# Patient Record
Sex: Male | Born: 1985
Health system: Southern US, Community
[De-identification: ages and names within clinical notes are randomized; demographics above are authoritative.]

## PROBLEM LIST (undated history)

## (undated) ENCOUNTER — Ambulatory Visit (HOSPITAL_COMMUNITY): Admission: EM | Payer: Self-pay | Source: Home / Self Care

## (undated) DIAGNOSIS — E785 Hyperlipidemia, unspecified: Secondary | ICD-10-CM

## (undated) DIAGNOSIS — D649 Anemia, unspecified: Secondary | ICD-10-CM

## (undated) DIAGNOSIS — E119 Type 2 diabetes mellitus without complications: Secondary | ICD-10-CM

## (undated) HISTORY — PX: NO PAST SURGERIES: SHX2092

## (undated) HISTORY — DX: Hyperlipidemia, unspecified: E78.5

## (undated) HISTORY — DX: Anemia, unspecified: D64.9

## (undated) HISTORY — DX: Type 2 diabetes mellitus without complications: E11.9

---
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
## 2012-03-23 ENCOUNTER — Encounter (HOSPITAL_COMMUNITY): Payer: Self-pay | Admitting: *Deleted

## 2012-03-23 ENCOUNTER — Emergency Department (HOSPITAL_COMMUNITY)
Admission: EM | Admit: 2012-03-23 | Discharge: 2012-03-24 | Payer: Self-pay | Attending: Emergency Medicine | Admitting: Emergency Medicine

## 2012-03-23 ENCOUNTER — Emergency Department (HOSPITAL_COMMUNITY): Payer: Self-pay

## 2012-03-23 DIAGNOSIS — S1191XA Laceration without foreign body of unspecified part of neck, initial encounter: Secondary | ICD-10-CM

## 2012-03-23 DIAGNOSIS — E669 Obesity, unspecified: Secondary | ICD-10-CM | POA: Insufficient documentation

## 2012-03-23 DIAGNOSIS — S1190XA Unspecified open wound of unspecified part of neck, initial encounter: Secondary | ICD-10-CM | POA: Insufficient documentation

## 2012-03-23 DIAGNOSIS — W278XXA Contact with other nonpowered hand tool, initial encounter: Secondary | ICD-10-CM | POA: Insufficient documentation

## 2012-03-23 MED ORDER — SODIUM CHLORIDE 0.9 % IV BOLUS (SEPSIS)
1000.0000 mL | Freq: Once | INTRAVENOUS | Status: AC
  Administered 2012-03-23: 1000 mL via INTRAVENOUS

## 2012-03-23 MED ORDER — TETANUS-DIPHTH-ACELL PERTUSSIS 5-2.5-18.5 LF-MCG/0.5 IM SUSP
0.5000 mL | Freq: Once | INTRAMUSCULAR | Status: AC
  Administered 2012-03-23: 0.5 mL via INTRAMUSCULAR
  Filled 2012-03-23: qty 0.5

## 2012-03-23 MED ORDER — IOHEXOL 350 MG/ML SOLN
50.0000 mL | Freq: Once | INTRAVENOUS | Status: AC | PRN
  Administered 2012-03-23: 50 mL via INTRAVENOUS
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
## 2012-03-23 NOTE — ED Notes (Signed)
Blood noted to new dry sterile gauze. Re-inforced with gauze and hypafix tape, will continue to monitor. No other changes. Denies sx or pain.  VSS.To CT.

## 2012-03-23 NOTE — ED Notes (Signed)
MD Kohut at bedside. 
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
## 2012-03-23 NOTE — ED Notes (Signed)
Brought pt directly back from lobby, Dr. Juleen China to bedside to assess pt

## 2012-03-23 NOTE — ED Notes (Signed)
Report given to [facility], [place], pt to go from CT to CDU. CT notified.
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
## 2012-03-23 NOTE — ED Notes (Signed)
Pt has laceration to rt side of neck/ pt denies pain/ pt is alert and oriented x4, laceration dressed and bleeding is controlled

## 2012-03-23 NOTE — ED Notes (Signed)
[employer] is at pt bedside.
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
## 2012-03-24 NOTE — ED Notes (Signed)
MD is at the Bedside with suture cart

## 2012-04-04 ENCOUNTER — Emergency Department (HOSPITAL_COMMUNITY)
Admission: EM | Admit: 2012-04-04 | Discharge: 2012-04-05 | Discharge status: Against Medical Advice | Payer: Self-pay | Attending: Emergency Medicine | Admitting: Emergency Medicine

## 2012-04-04 ENCOUNTER — Encounter (HOSPITAL_COMMUNITY): Payer: Self-pay | Admitting: Emergency Medicine

## 2012-04-04 DIAGNOSIS — M542 Cervicalgia: Secondary | ICD-10-CM | POA: Insufficient documentation

## 2012-04-04 NOTE — ED Notes (Signed)
Pt's name called x2 no answer 
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
## 2014-04-23 DIAGNOSIS — S01511A Laceration without foreign body of lip, initial encounter: Secondary | ICD-10-CM | POA: Insufficient documentation

## 2014-04-24 ENCOUNTER — Encounter (HOSPITAL_COMMUNITY): Payer: Self-pay | Admitting: Emergency Medicine

## 2014-04-24 ENCOUNTER — Emergency Department (HOSPITAL_COMMUNITY)
Admission: EM | Admit: 2014-04-24 | Discharge: 2014-04-24 | Disposition: A | Discharge status: Home / Self Care | Payer: Self-pay | Attending: Emergency Medicine | Admitting: Emergency Medicine

## 2014-04-24 DIAGNOSIS — S01511A Laceration without foreign body of lip, initial encounter: Secondary | ICD-10-CM

## 2014-04-24 MED ORDER — LIDOCAINE-EPINEPHRINE (PF) 2 %-1:200000 IJ SOLN
10.0000 mL | Freq: Once | INTRAMUSCULAR | Status: AC
  Administered 2014-04-24: 10 mL
  Filled 2014-04-24: qty 10

## 2014-04-24 NOTE — Discharge Instructions (Signed)
Keep area clean and dry with soap and water. Return for suture removal in 5-7 days.  Open Wound, Lip An open wound is a break in the skin caused by an injury. An open wound to the lip can be a scrape, cut, or hole (puncture). Good wound care will help:   Lessen pain.  Prevent infection.  Lessen scarring. HOME CARE  Wash off all dirt.  Clean your wounds daily with gentle soap and water.  Eat soft foods or liquids while your wound is healing.  Rinse the wound with salt water after each meal.  Apply medicated cream after the wound has been cleaned as told by your doctor.  Follow up with your doctor as told. GET HELP RIGHT AWAY IF:   There is more redness or puffiness (swelling) in or around the wound.  There is increasing pain.  You have a temperature by mouth above 102 F (38.9 C), not controlled by medicine.  Your baby is older than 3 months with a rectal temperature of 102 F (38.9 C) or higher.  Your baby is 643 months old or younger with a rectal temperature of 100.4 F (38 C) or higher.  There is yellowish white fluid (pus) coming from the wound.  Very bad pain develops that is not controlled with medicine.  There is a red line on the skin above or below the wound. MAKE SURE YOU:   Understand these instructions.  Will watch this condition.  Will get help right away if you are not doing well or get worse. Document Released: 09/30/2008 Document Revised: 09/26/2011 Document Reviewed: 10/20/2009 [facility] Patient Information 2015 [employer] [place]. This information is not intended to replace advice given to you by your health care provider. Make sure you discuss any questions you have with your health care provider.

## 2014-04-24 NOTE — ED Notes (Signed)
PA Humes at bedside for suture.
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
## 2014-04-24 NOTE — ED Provider Notes (Signed)
Medical screening examination/treatment/procedure(s) were performed by non-physician practitioner and as supervising physician I was immediately available for consultation/collaboration.   EKG Interpretation None        Emmarie Sannes, MD 04/24/14 0647 

## 2014-04-30 ENCOUNTER — Encounter (HOSPITAL_COMMUNITY): Payer: Self-pay | Admitting: Emergency Medicine

## 2014-04-30 ENCOUNTER — Emergency Department (HOSPITAL_COMMUNITY)
Admission: EM | Admit: 2014-04-30 | Discharge: 2014-04-30 | Disposition: A | Discharge status: Home / Self Care | Payer: Self-pay | Attending: Emergency Medicine | Admitting: Emergency Medicine

## 2014-04-30 DIAGNOSIS — Z4802 Encounter for removal of sutures: Secondary | ICD-10-CM | POA: Insufficient documentation

## 2014-04-30 DIAGNOSIS — Z791 Long term (current) use of non-steroidal anti-inflammatories (NSAID): Secondary | ICD-10-CM | POA: Insufficient documentation

## 2014-04-30 NOTE — Discharge Instructions (Signed)

## 2014-04-30 NOTE — ED Notes (Signed)
Pt states he had sutures put in a week ago today on his lip and has come to have them removed. No evidence of infection. Alert and oriented.
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
## 2014-05-08 NOTE — ED Provider Notes (Signed)
Medical screening examination/treatment/procedure(s) were performed by non-physician practitioner and as supervising physician I was immediately available for consultation/collaboration.   Adelayde Minney L Barnet Benavides, MD 05/08/14 2118 

## 2015-03-09 ENCOUNTER — Encounter (HOSPITAL_COMMUNITY): Payer: Self-pay | Admitting: Emergency Medicine

## 2015-03-09 ENCOUNTER — Emergency Department (HOSPITAL_COMMUNITY)
Admission: EM | Admit: 2015-03-09 | Discharge: 2015-03-09 | Disposition: A | Discharge status: Home / Self Care | Payer: Self-pay | Attending: Emergency Medicine | Admitting: Emergency Medicine

## 2015-03-09 DIAGNOSIS — K088 Other specified disorders of teeth and supporting structures: Secondary | ICD-10-CM | POA: Insufficient documentation

## 2015-03-09 DIAGNOSIS — K0889 Other specified disorders of teeth and supporting structures: Secondary | ICD-10-CM

## 2015-03-09 MED ORDER — NAPROXEN 500 MG PO TABS: 500.0000 mg | ORAL_TABLET | Freq: Two times a day (BID) | ORAL | Status: DC

## 2015-03-09 NOTE — ED Notes (Signed)
Pt A+ox4, reports c/o R upper dental pain since last night.  No relief with goody powder.  Pt denies other complaints.  Skin PWD.  Speaking full/clear sentences, rr even/un-lab.  NAD.

## 2015-03-09 NOTE — Discharge Instructions (Signed)
Take the prescribed medication as directed. Follow-up with dentist.  See referrals and resource guide below to help with this. Return to the ED for new or worsening symptoms.   Emergency Department Resource Guide 1) Find a Doctor and Pay Out of Pocket Although you won't have to find out who is covered by your insurance plan, it is a good idea to ask around and get recommendations. You will then need to call the office and see if the doctor you have chosen will accept you as a new patient and what types of options they offer for patients who are self-pay. Some doctors offer discounts or will set up payment plans for their patients who do not have insurance, but you will need to ask so you aren't surprised when you get to your appointment.  2) Contact Your Local Health Department Not all health departments have doctors that can see patients for sick visits, but many do, so it is worth a call to see if yours does. If you don't know where your local health department is, you can check in your phone book. The CDC also has a tool to help you locate your state's health department, and many state websites also have listings of all of their local health departments.  3) Find a Walk-in Clinic If your illness is not likely to be very severe or complicated, you may want to try a walk in clinic. These are popping up all over the country in pharmacies, drugstores, and shopping centers. They're usually staffed by nurse practitioners or physician assistants that have been trained to treat common illnesses and complaints. They're usually fairly quick and inexpensive. However, if you have serious medical issues or chronic medical problems, these are probably not your best option.  No Primary Care Doctor: - Call Health Connect at  [phone] - they can help you locate a primary care doctor that  accepts your insurance, provides certain services, etc. - Physician Referral Service- [phone]  Chronic Pain  Problems: Organization         Address  Phone   Notes  [facility]  [phone] Patients need to be referred by their primary care doctor.   Medication Assistance: Organization         Address  Phone   Notes  [facility] Medication [facility] [address] [place] [place], [place] [address] [phone] --Must be a resident of [facility] -- Must have NO insurance coverage whatsoever (no Medicaid/ Medicare, etc.) -- The pt. MUST have a primary care doctor that directs their care regularly and follows them in the community   [facility]  [phone]   [employer]  [phone]    Agencies that provide inexpensive medical care: Organization         Address  Phone   Notes  [facility]  [phone]   [facility]    [phone]   [facility] [address] [place], [place] [address] [phone]   [facility] [address] [place] [place] [phone]   Planned Parenthood    [phone]   [facility]    [phone]   [facility]  [address], [place] Phone:  [phone], Fax:  [phone] Hours of Operation:  9 am - 6 pm, M-F.  Also accepts Medicaid/Medicare and self-pay.  [facility]  [address]  [place], [place], [place] Phone: [phone], Fax: [phone]. Hours of Operation:  8:30 am - 5:30 pm, M-F.  Also accepts Medicaid and self-pay.  [facility] [place] [address], [place] Phone: [phone]   [place], [place], [place] [phone], Ext. [phone] Mondays & Thursdays: 7-9 AM.  First 15 patients are seen on a first come, first serve basis.    [place] Providers:  Organization         Address  Phone   Notes  [facility] [address], [place] [phone] Also  accepts self-pay patients.  [facility] [address], [place]  [phone]   [place], [address], [place] [phone]   [facility] [address] [place] [phone]   Lucianne Lei [address], [place]   [phone] Only accepts [place] Access [place] patients after they have their name applied to their card.   Self-Pay (no insurance) in [facility]:  Organization         Address  Phone   Notes  Sickle Cell Patients, [facility] [place] [phone]   [facility] [place] [phone]   [facility] [place]  [place], [place],  [phone]   [facility]/Dr. Osei-Bonsu  [address] [place] or [address], [place] [phone] Phone number for both [place] and [place] locations is the same.  [facility]gent [facility] [address], [place] [phone]   [facility] [address] [place] or [address] [phone]   [facility] [address], [place] [phone], phone; [phone], fax Sees patients 1st and 3rd Saturday of every month.  Must not qualify for public or private insurance (i.e. Medicaid, Medicare, [place] Health Choice, Veterans' Benefits)  Household income should be no more than 200% of the poverty level The clinic cannot treat you if you are pregnant or think you are pregnant  Sexually transmitted diseases are not treated at the clinic.    Dental Care: Organization         Address  Phone  Notes  [facility] of [facility] [place] [phone] Accepts children up to age 34 who are enrolled in [place] or [place]; pregnant  women with a Medicaid card; and children who have applied for Medicaid or [place] Health Choice, but were declined, whose parents can pay a reduced fee at time of service.  [facility]  [address], [place] [phone] Accepts children up to age 24 who are enrolled in [place] or [place]; pregnant women with a Medicaid card; and children who have applied for Medicaid or [place] Health Choice, but were declined, whose parents can pay a reduced fee at time of service.  [facility]  [place] [phone] Patients are seen by appointment only. Walk-ins are not accepted. [place] will see patients 57 years of age and older. Monday - Tuesday (8am-5pm) Most Wednesdays (8:30-5pm) $30 per visit, cash only  [place]  [address], [place] [phone] Patients are seen by appointment only. Walk-ins are not accepted. [facility] will see patients 18 years of age and older. °One Wednesday Evening (Monthly: Volunteer Based).  $30 per visit, cash only  °[facility]  [phone] for adults; Children under age 4, call Graduate Pediatric Dentistry at [phone]. Children aged 4-14, please call [phone] to request a pediatric application. ° Dental services are provided in all areas of dental care including fillings, crowns and bridges, complete and partial dentures, implants, gum treatment, root canals, and extractions. Preventive care is also provided. Treatment is provided to both adults and children. °Patients are selected via a lottery and there is often a waiting list. °  °[facility] [address], °[place] ° ([phone] www.drcivils.com °  °[facility] [address], [place], [place] ([phone], Ext. [phone] Second and Fourth Thursday of each month, opens at 6:30 AM; Clinic ends at 9 AM.  Patients are  seen on a first-come first-served basis, and a limited number are seen during each clinic.  ° °[facility] ° [address], [place], [place] [phone]   Eligibility Requirements °You must have lived in [place], [place], or [place] counties for at least the last three months. °  You cannot be eligible for state or federal sponsored healthcare insurance, including Veterans Administration, Medicaid, or Medicare. °  You generally cannot be eligible for healthcare insurance through your employer.  °  How to apply: °Eligibility screenings are held every Tuesday and Wednesday afternoon from 1:00 pm until 4:00 pm. You do not need an appointment for the interview!  °[facility] [address], [place], [place] [phone]   °[facility]  [phone]   °[facility]  [phone]   °[facility]  [phone]   ° °Behavioral Health Resources in the Community: °Intensive Outpatient Programs °Organization         Address  Phone  Notes  °[facility] [address], [place], [place] [phone]   °[facility] [address], [place], [place] [phone]   °[facility] [address], [place], [place] ° 336-882-2125   °[facility] [address],  °[place], [place] 1-800-853-5163 or 336-641-4981   °Substance Abuse Resources °Organization         Address  Phone  Notes  °[facility]  336-882-2125   °[facility]  336-784-9470   °[facility]  336-285-9073   °[facility]  336-845-3988   °Residential & Outpatient Substance Abuse Program  1-800-659-3381   °Psychological Services °Organization         Address  Phone  Notes  ° Health  336- [phone]   °[facility]  336- 378-7881   °[facility] [address], [place] 1-800-853-5163 or 336-641-4981   ° °Mobile Crisis  Teams °Organization         Address  Phone  Notes  °[facility], Mobile Crisis Care Unit  1-877-626-1772   °Assertive °Psychotherapeutic Services ° [address] [place], [place] 336-834-9664   °Sharon DeEsch [address] °[place] 336-554-5454   ° °Self-Help/Support Groups °Organization         Address  Phone             Notes  °[facility] [place] -   variety of support groups  336- 336-389-6044 Call for more information  Narcotics Anonymous (NA), [facility] [address], [employer] [place]  2 meetings at this location   Residential Facilities manager         Address  Phone  Notes  [facility] [place],    [place]  1-463-457-0038   [facility]  [address] [place] [address], [place], [place]   [place], [place] [phone] Admissions: 8am-3pm M-F  [facility] [place] [address],    [place], [place] [phone]   [facility] [address] [place], [place], [place]   [facility] [address]  [place], [place]   [facility] - Intensive Outpatient [address], [place], [place]   [facility] [place] [address]  [place], [place] 1-640-386-3300 or [phone]   [facility] ([facility]) [address] [place], [place] Accepts Medicaid  [facility] [place] [address],  [place] 1-646-193-9807 Substance Abuse/Addiction Treatment   [facility] Organization         Address  Phone  Notes  [facility]  [phone]   Domenic Schwab, PhD [address] [place], [place]   (925) [phone] or ([phone]   [facility] [place], [place] [phone]   [facility] [address] [place], [place] [phone]  Insurance/Medicaid/sponsorship through [facility] and Families [address], [address]                                    [place], [place] [phone] [place] [address], [place] [phone]    Dr. Adele Schilder  [phone]   [facility] Dept. 1) [address] [place] 2) [place] 3)  [place] [address], [place] [phone]  [phone]   [place] [phone] or [phone] (After Hours)

## 2015-03-09 NOTE — ED Provider Notes (Signed)
CSN: [id number]     Arrival date & time 03/09/15  1219 History  This chart was scribed for non-physician practitioner Sharilyn Sites, PA-C working with Melene Plan, DO by Littie Deeds, ED Scribe. This patient was seen in room WTR8/WTR8 and the patient's care was started at 12:38 PM.      Chief Complaint  Patient presents with  . Dental Pain    R upper, since last night   The history is provided by the patient. No language interpreter was used.   HPI Comments: Tyrone Steele is a 29 y.o. male who presents to the Emergency Department complaining of gradual onset right upper dental pain that started last night. He does not feel any swelling to the area. The pain is worse with air exposure. He has tried goody powder with some relief. Patient denies fever and difficulty swallowing. He also denies injury. NKDA. He does not have a dentist.   History reviewed. No pertinent past medical history. History reviewed. No pertinent past surgical history. No family history on file. Social History  Substance Use Topics  . Smoking status: Never Smoker   . Smokeless tobacco: None  . Alcohol Use: No    Review of Systems  Constitutional: Negative for fever.  HENT: Positive for dental problem. Negative for trouble swallowing.   All other systems reviewed and are negative.     Allergies  Review of patient's allergies indicates no known allergies.  Home Medications   Prior to Admission medications   Medication Sig Start Date End Date Taking? Authorizing Provider  Aspirin-Acetaminophen-Caffeine [phone] MG PACK Take 1 packet by mouth daily as needed (dental pain.).   Yes Historical Provider, MD   BP 150/95 mmHg  Pulse 80  Temp(Src) 98.1 F (36.7 C) (Oral)  Resp 20  Ht  (1.753 m)  Wt 225 lb (102.059 kg)  BMI 33.21 kg/m2  SpO2 99%   Physical Exam  Constitutional: He is oriented to person, place, and time. He appears well-developed and well-nourished.  HENT:  Head: Normocephalic and  atraumatic.  Mouth/Throat: Oropharynx is clear and moist.  Teeth largely in fair dentition, gold plated front grill in place, right upper premolar TTP, pain reproduced with air hitting tooth, surrounding gingiva normal in appearance without signs of abscess formation, handling secretions appropriately, no trismus  Eyes: Conjunctivae and EOM are normal. Pupils are equal, round, and reactive to light.  Neck: Normal range of motion.  Cardiovascular: Normal rate, regular rhythm and normal heart sounds.   Pulmonary/Chest: Effort normal and breath sounds normal.  Abdominal: Soft. Bowel sounds are normal.  Musculoskeletal: Normal range of motion.  Neurological: He is alert and oriented to person, place, and time.  Skin: Skin is warm and dry.  Psychiatric: He has a normal mood and affect.  Nursing note and vitals reviewed.   ED Course  Procedures  DIAGNOSTIC STUDIES: Oxygen Saturation is 99% on room air, normal by my interpretation.    COORDINATION OF CARE: 12:40 PM-Discussed treatment plan which includes pain medication and dental referral with patient/guardian at bedside and patient/guardian agreed to plan.    Labs Review Labs Reviewed - No data to display  Imaging Review No results found. I have personally reviewed and evaluated these images and lab results as part of my medical decision-making.   EKG Interpretation None      MDM   Final diagnoses:  Pain, dental   Dental pain without signs of dental abscess. No facial or neck swelling to suggest Ludwig's angina.  VSS.  Patient will be referred to dentist, referral and resource guide provided.  Rx naprosyn.  Discussed plan with patient, he/she acknowledged understanding and agreed with plan of care.  Return precautions given for new or worsening symptoms.  I personally performed the services described in this documentation, which was scribed in my presence. The recorded information has been reviewed and is accurate.  Garlon Hatchet, PA-C 03/09/15 1252  Melene Plan, DO 03/09/15 1758

## 2015-05-25 ENCOUNTER — Encounter (HOSPITAL_COMMUNITY): Payer: Self-pay | Admitting: Emergency Medicine

## 2015-05-25 ENCOUNTER — Emergency Department (HOSPITAL_COMMUNITY)
Admission: EM | Admit: 2015-05-25 | Discharge: 2015-05-25 | Disposition: A | Discharge status: Home / Self Care | Payer: Self-pay | Attending: Emergency Medicine | Admitting: Emergency Medicine

## 2015-05-25 DIAGNOSIS — Z791 Long term (current) use of non-steroidal anti-inflammatories (NSAID): Secondary | ICD-10-CM | POA: Insufficient documentation

## 2015-05-25 DIAGNOSIS — K0889 Other specified disorders of teeth and supporting structures: Secondary | ICD-10-CM | POA: Insufficient documentation

## 2015-05-25 MED ORDER — NAPROXEN 500 MG PO TABS: 500.0000 mg | ORAL_TABLET | Freq: Two times a day (BID) | ORAL | Status: DC

## 2015-05-25 MED ORDER — PENICILLIN V POTASSIUM 500 MG PO TABS
500.0000 mg | ORAL_TABLET | Freq: Four times a day (QID) | ORAL | Status: DC
  Administered 2015-05-25: 500 mg via ORAL
  Filled 2015-05-25: qty 1

## 2015-05-25 MED ORDER — PENICILLIN V POTASSIUM 500 MG PO TABS: 500.0000 mg | ORAL_TABLET | Freq: Four times a day (QID) | ORAL | Status: DC

## 2015-05-25 MED ORDER — NAPROXEN 500 MG PO TABS
500.0000 mg | ORAL_TABLET | Freq: Once | ORAL | Status: AC
  Administered 2015-05-25: 500 mg via ORAL
  Filled 2015-05-25: qty 1

## 2015-05-25 NOTE — ED Provider Notes (Signed)
CSN: [id number]     Arrival date & time 05/25/15  2027 History  By signing my name below, I, Tyrone Steele, attest that this documentation has been prepared under the direction and in the presence of [employer], PA-C. Electronically Signed: Octavia Steele, ED Scribe. 05/25/2015. 9:21 PM.    Chief Complaint  Patient presents with  . Facial Swelling      The history is provided by the patient. No language interpreter was used.   HPI Comments: Tyrone Steele is a 29 y.o. male who presents to the Emergency Department complaining of constant, moderate, gradual worsening right sided facial swelling onset today. Pt reports having associated right upper dental pain. Pt states he woke up this morning with sudden right sided facial swelling and right upper dental pain and reports it became gradually worse throughout the day. Pt denies fever and drainage from area. He has no known drug allergies.  History reviewed. No pertinent past medical history. History reviewed. No pertinent past surgical history. History reviewed. No pertinent family history. Social History  Substance Use Topics  . Smoking status: Never Smoker   . Smokeless tobacco: None  . Alcohol Use: No    Review of Systems  Constitutional: Negative for fever.  HENT: Positive for dental problem and facial swelling.   All other systems reviewed and are negative.     Allergies  Review of patient's allergies indicates no known allergies.  Home Medications   Prior to Admission medications   Medication Sig Start Date End Date Taking? Authorizing Provider  Aspirin-Acetaminophen-Caffeine [phone] MG PACK Take 1 packet by mouth daily as needed (dental pain.).    Historical Provider, MD  naproxen (NAPROSYN) 500 MG tablet Take 1 tablet (500 mg total) by mouth 2 (two) times daily with a meal. 03/09/15   [place] Hatchet, PA-C   Triage vitals: BP 126/79 mmHg  Pulse 89  Temp(Src) 98.9 F (37.2 C) (Oral)  Resp 20  Ht   (1.753 m)  Wt 220 lb (99.791 kg)  BMI 32.47 kg/m2  SpO2 100% Physical Exam  Constitutional: He appears well-developed and well-nourished. No distress.  HENT:  Head: Normocephalic and atraumatic.  Good dentition. Right upper molar tenderness to percussion. No abscess noted.   Eyes: Conjunctivae are normal. Right eye exhibits no discharge. Left eye exhibits no discharge.  Neck: Normal range of motion.  Cardiovascular: Normal rate and regular rhythm.  Exam reveals no gallop and no friction rub.   No murmur heard. Pulmonary/Chest: Effort normal and breath sounds normal. No respiratory distress. He has no wheezes. He has no rales. He exhibits no tenderness.  Abdominal: Soft. He exhibits no distension. There is no tenderness.  Musculoskeletal: Normal range of motion.  Neurological: He is alert. Coordination normal.  Speech is goal-oriented. Moves limbs without ataxia.   Skin: Skin is warm and dry. No rash noted. He is not diaphoretic.  Psychiatric: He has a normal mood and affect. His behavior is normal.  Nursing note and vitals reviewed.   ED Course  Procedures  DIAGNOSTIC STUDIES: Oxygen Saturation is 100% on RA, normal by my interpretation.  COORDINATION OF CARE:  9:21 PM Discussed treatment plan which includes antibiotics and referral to dentist with pt at bedside and pt agreed to plan.  Labs Review Labs Reviewed - No data to display  Imaging Review No results found. I have personally reviewed and evaluated these images and lab results as part of my medical decision-making.   EKG Interpretation None  MDM   Final diagnoses:  Pain, dental    Patient will have Veetid and naprosyn for symptoms. No signs of ludwigs angina. Vitals stable and patient afebrile. Patient will have dental referral and instructions to return to the ED with worsening or concerning symptoms.   I personally performed the services described in this documentation, which was scribed in my presence.  The recorded information has been reviewed and is accurate.    Emilia BeckKaitlyn Carry Weesner, PA-C 05/27/15 0431  Dione Boozeavid Glick, MD 05/27/15 [phone]

## 2015-05-25 NOTE — ED Notes (Addendum)
Pt states that he woke up with R sided jaw swelling and pain. Does have dental pain as well but does not know if it is related. Alert and oriented.

## 2015-05-25 NOTE — Discharge Instructions (Signed)
Take Veetid as directed until gone. Take naprosyn as needed for pain. Refer to attached documents for more information. Follow up with the recommended dentist and return to the ED with worsening or concerning symptoms.

## 2015-09-19 ENCOUNTER — Inpatient Hospital Stay (HOSPITAL_COMMUNITY)
Admission: EM | Admit: 2015-09-19 | Discharge: 2015-09-22 | DRG: 964 | Disposition: A | Discharge status: Home / Self Care | Payer: Self-pay | Attending: General Surgery | Admitting: General Surgery

## 2015-09-19 DIAGNOSIS — S0191XA Laceration without foreign body of unspecified part of head, initial encounter: Secondary | ICD-10-CM

## 2015-09-19 DIAGNOSIS — S2239XA Fracture of one rib, unspecified side, initial encounter for closed fracture: Secondary | ICD-10-CM

## 2015-09-19 DIAGNOSIS — S3692XA Contusion of unspecified intra-abdominal organ, initial encounter: Secondary | ICD-10-CM

## 2015-09-19 DIAGNOSIS — S27321A Contusion of lung, unilateral, initial encounter: Secondary | ICD-10-CM | POA: Diagnosis present

## 2015-09-19 DIAGNOSIS — S2249XA Multiple fractures of ribs, unspecified side, initial encounter for closed fracture: Secondary | ICD-10-CM

## 2015-09-19 DIAGNOSIS — S2242XA Multiple fractures of ribs, left side, initial encounter for closed fracture: Secondary | ICD-10-CM | POA: Diagnosis present

## 2015-09-19 DIAGNOSIS — S27329A Contusion of lung, unspecified, initial encounter: Secondary | ICD-10-CM

## 2015-09-19 DIAGNOSIS — S0101XA Laceration without foreign body of scalp, initial encounter: Secondary | ICD-10-CM | POA: Diagnosis present

## 2015-09-19 DIAGNOSIS — S36892A Contusion of other intra-abdominal organs, initial encounter: Secondary | ICD-10-CM | POA: Diagnosis present

## 2015-09-19 DIAGNOSIS — D62 Acute posthemorrhagic anemia: Secondary | ICD-10-CM | POA: Diagnosis present

## 2015-09-19 DIAGNOSIS — S35298A Other injury of branches of celiac and mesenteric artery, initial encounter: Principal | ICD-10-CM | POA: Diagnosis present

## 2015-09-19 DIAGNOSIS — S2232XA Fracture of one rib, left side, initial encounter for closed fracture: Secondary | ICD-10-CM | POA: Diagnosis present
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
## 2015-09-20 ENCOUNTER — Emergency Department (HOSPITAL_COMMUNITY): Payer: Self-pay

## 2015-09-20 ENCOUNTER — Emergency Department (HOSPITAL_COMMUNITY): Payer: No Typology Code available for payment source

## 2015-09-20 ENCOUNTER — Encounter (HOSPITAL_COMMUNITY): Payer: Self-pay

## 2015-09-20 DIAGNOSIS — D62 Acute posthemorrhagic anemia: Secondary | ICD-10-CM | POA: Diagnosis not present

## 2015-09-20 DIAGNOSIS — S27321A Contusion of lung, unilateral, initial encounter: Secondary | ICD-10-CM | POA: Diagnosis present

## 2015-09-20 DIAGNOSIS — S36892A Contusion of other intra-abdominal organs, initial encounter: Secondary | ICD-10-CM | POA: Diagnosis present

## 2015-09-20 DIAGNOSIS — S2232XA Fracture of one rib, left side, initial encounter for closed fracture: Secondary | ICD-10-CM | POA: Diagnosis present

## 2015-09-20 LAB — CBC WITH DIFFERENTIAL/PLATELET
BASOS ABS: 0 10*3/uL (ref 0.0–0.1)
Basophils Relative: 0 %
EOS ABS: 0.1 10*3/uL (ref 0.0–0.7)
EOS PCT: 1 %
HCT: 40.5 % (ref 39.0–52.0)
Hemoglobin: 13.1 g/dL (ref 13.0–17.0)
LYMPHS PCT: 10 %
Lymphs Abs: 1 10*3/uL (ref 0.7–4.0)
MCH: 27.2 pg (ref 26.0–34.0)
MCHC: 32.3 g/dL (ref 30.0–36.0)
MCV: 84.2 fL (ref 78.0–100.0)
MONO ABS: 1 10*3/uL (ref 0.1–1.0)
Monocytes Relative: 10 %
Neutro Abs: 8.5 10*3/uL — ABNORMAL HIGH (ref 1.7–7.7)
Neutrophils Relative %: 80 %
PLATELETS: 78 10*3/uL — AB (ref 150–400)
RBC: 4.81 MIL/uL (ref 4.22–5.81)
RDW: 15.1 % (ref 11.5–15.5)
WBC: 10.6 10*3/uL — AB (ref 4.0–10.5)

## 2015-09-20 LAB — HEPATIC FUNCTION PANEL
ALK PHOS: 44 U/L (ref 38–126)
ALT: 118 U/L — AB (ref 17–63)
AST: 113 U/L — AB (ref 15–41)
Albumin: 3.9 g/dL (ref 3.5–5.0)
BILIRUBIN DIRECT: 0.2 mg/dL (ref 0.1–0.5)
BILIRUBIN INDIRECT: 0.4 mg/dL (ref 0.3–0.9)
BILIRUBIN TOTAL: 0.6 mg/dL (ref 0.3–1.2)
Total Protein: 7 g/dL (ref 6.5–8.1)

## 2015-09-20 LAB — CBC
HEMATOCRIT: 37.1 % — AB (ref 39.0–52.0)
Hemoglobin: 11.6 g/dL — ABNORMAL LOW (ref 13.0–17.0)
MCH: 25.8 pg — ABNORMAL LOW (ref 26.0–34.0)
MCHC: 31.3 g/dL (ref 30.0–36.0)
MCV: 82.6 fL (ref 78.0–100.0)
Platelets: 181 10*3/uL (ref 150–400)
RBC: 4.49 MIL/uL (ref 4.22–5.81)
RDW: 15.5 % (ref 11.5–15.5)
WBC: 9.6 10*3/uL (ref 4.0–10.5)

## 2015-09-20 LAB — BASIC METABOLIC PANEL
ANION GAP: 8 (ref 5–15)
BUN: 19 mg/dL (ref 6–20)
CALCIUM: 8.9 mg/dL (ref 8.9–10.3)
CO2: 25 mmol/L (ref 22–32)
Chloride: 106 mmol/L (ref 101–111)
Creatinine, Ser: 1.25 mg/dL — ABNORMAL HIGH (ref 0.61–1.24)
GFR calc Af Amer: >60 mL/min (ref >60)
GLUCOSE: 116 mg/dL — AB (ref 65–99)
POTASSIUM: 4 mmol/L (ref 3.5–5.1)
SODIUM: 139 mmol/L (ref 135–145)

## 2015-09-20 LAB — AMYLASE: Amylase: 62 U/L (ref 28–100)

## 2015-09-20 LAB — TYPE AND SCREEN
ABO/RH(D): O POS
Antibody Screen: NEGATIVE

## 2015-09-20 LAB — PROTIME-INR
INR: 1.08 (ref 0.00–1.49)
PROTHROMBIN TIME: 14.2 s (ref 11.6–15.2)

## 2015-09-20 LAB — ETHANOL

## 2015-09-20 LAB — ABO/RH: ABO/RH(D): O POS

## 2015-09-20 LAB — MRSA PCR SCREENING: MRSA by PCR: NEGATIVE

## 2015-09-20 LAB — CDS SEROLOGY

## 2015-09-20 MED ORDER — SODIUM CHLORIDE 0.9 % IV SOLN
INTRAVENOUS | Status: DC
  Administered 2015-09-21: 19:00:00 via INTRAVENOUS

## 2015-09-20 MED ORDER — SODIUM CHLORIDE 0.9 % IV SOLN
INTRAVENOUS | Status: DC
  Administered 2015-09-20 (×2): 125 mL/h via INTRAVENOUS
  Administered 2015-09-20: 23:00:00 via INTRAVENOUS

## 2015-09-20 MED ORDER — ONDANSETRON HCL 4 MG/2ML IJ SOLN: 4.0000 mg | Freq: Four times a day (QID) | INTRAMUSCULAR | Status: DC | PRN

## 2015-09-20 MED ORDER — MORPHINE SULFATE (PF) 2 MG/ML IV SOLN
2.0000 mg | INTRAVENOUS | Status: DC | PRN
  Administered 2015-09-20 (×3): 2 mg via INTRAVENOUS
  Filled 2015-09-20 (×3): qty 1

## 2015-09-20 MED ORDER — HYDROMORPHONE HCL 1 MG/ML IJ SOLN
1.0000 mg | Freq: Once | INTRAMUSCULAR | Status: AC
  Administered 2015-09-20: 1 mg via INTRAVENOUS
  Filled 2015-09-20: qty 1

## 2015-09-20 MED ORDER — MORPHINE SULFATE (PF) 4 MG/ML IV SOLN: 4.0000 mg | INTRAVENOUS | Status: DC | PRN

## 2015-09-20 MED ORDER — ONDANSETRON HCL 4 MG/2ML IJ SOLN
4.0000 mg | Freq: Once | INTRAMUSCULAR | Status: AC
  Administered 2015-09-20: 4 mg via INTRAVENOUS
  Filled 2015-09-20: qty 2

## 2015-09-20 MED ORDER — HYDROMORPHONE HCL 1 MG/ML IJ SOLN
1.0000 mg | INTRAMUSCULAR | Status: DC | PRN
  Administered 2015-09-21 (×2): 1 mg via INTRAVENOUS
  Filled 2015-09-20 (×2): qty 1

## 2015-09-20 MED ORDER — PANTOPRAZOLE SODIUM 40 MG PO TBEC: 40.0000 mg | DELAYED_RELEASE_TABLET | Freq: Every day | ORAL | Status: DC

## 2015-09-20 MED ORDER — IOHEXOL 300 MG/ML  SOLN
100.0000 mL | Freq: Once | INTRAMUSCULAR | Status: AC | PRN
  Administered 2015-09-20: 100 mL via INTRAVENOUS

## 2015-09-20 MED ORDER — ENOXAPARIN SODIUM 40 MG/0.4ML ~~LOC~~ SOLN
40.0000 mg | SUBCUTANEOUS | Status: DC
  Administered 2015-09-21: 40 mg via SUBCUTANEOUS
  Filled 2015-09-20: qty 0.4

## 2015-09-20 MED ORDER — SODIUM CHLORIDE 0.9 % IV BOLUS (SEPSIS)
125.0000 mL | Freq: Once | INTRAVENOUS | Status: AC
  Administered 2015-09-20: 125 mL via INTRAVENOUS

## 2015-09-20 MED ORDER — PANTOPRAZOLE SODIUM 40 MG IV SOLR
40.0000 mg | Freq: Every day | INTRAVENOUS | Status: DC
  Administered 2015-09-20 – 2015-09-21 (×2): 40 mg via INTRAVENOUS
  Filled 2015-09-20 (×2): qty 40

## 2015-09-20 MED ORDER — LIDOCAINE-EPINEPHRINE 2 %-1:100000 IJ SOLN
20.0000 mL | Freq: Once | INTRAMUSCULAR | Status: AC
  Administered 2015-09-20: 20 mL
  Filled 2015-09-20: qty 1

## 2015-09-20 MED ORDER — MORPHINE SULFATE (PF) 2 MG/ML IV SOLN: 1.0000 mg | INTRAVENOUS | Status: DC | PRN

## 2015-09-20 MED ORDER — ONDANSETRON HCL 4 MG PO TABS: 4.0000 mg | ORAL_TABLET | Freq: Four times a day (QID) | ORAL | Status: DC | PRN
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
## 2015-09-20 NOTE — ED Notes (Signed)
Attempted report x1. 
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
## 2015-09-20 NOTE — ED Notes (Signed)
Pt given urinal.

## 2015-09-20 NOTE — ED Notes (Signed)
Report called to Consulting civil engineerCharge RN at [facility].
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
## 2015-09-20 NOTE — ED Notes (Signed)
Patient fiance at bedside.

## 2015-09-20 NOTE — ED Notes (Signed)
[facility] called for transport. 
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
## 2015-09-20 NOTE — ED Notes (Signed)
BLOOD DRAW DELAYED.  Pt currently at CT, RN will draw labs when he returns.
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
## 2015-09-21 ENCOUNTER — Inpatient Hospital Stay (HOSPITAL_COMMUNITY): Payer: No Typology Code available for payment source

## 2015-09-21 LAB — BASIC METABOLIC PANEL
Anion gap: 7 (ref 5–15)
BUN: 10 mg/dL (ref 6–20)
CHLORIDE: 108 mmol/L (ref 101–111)
CO2: 24 mmol/L (ref 22–32)
Calcium: 8.4 mg/dL — ABNORMAL LOW (ref 8.9–10.3)
Creatinine, Ser: 1.09 mg/dL (ref 0.61–1.24)
GFR calc Af Amer: >60 mL/min (ref >60)
GLUCOSE: 109 mg/dL — AB (ref 65–99)
POTASSIUM: 3.7 mmol/L (ref 3.5–5.1)
SODIUM: 139 mmol/L (ref 135–145)

## 2015-09-21 LAB — CBC
HCT: 35.6 % — ABNORMAL LOW (ref 39.0–52.0)
Hemoglobin: 11.3 g/dL — ABNORMAL LOW (ref 13.0–17.0)
MCH: 26.3 pg (ref 26.0–34.0)
MCHC: 31.7 g/dL (ref 30.0–36.0)
MCV: 83 fL (ref 78.0–100.0)
PLATELETS: 160 10*3/uL (ref 150–400)
RBC: 4.29 MIL/uL (ref 4.22–5.81)
RDW: 15.4 % (ref 11.5–15.5)
WBC: 7.5 10*3/uL (ref 4.0–10.5)

## 2015-09-21 LAB — AMYLASE: AMYLASE: 51 U/L (ref 28–100)

## 2015-09-21 LAB — LIPASE, BLOOD: Lipase: 26 U/L (ref 11–51)

## 2015-09-21 MED ORDER — CHLORHEXIDINE GLUCONATE 0.12 % MT SOLN
OROMUCOSAL | Status: AC
  Administered 2015-09-21: 15 mL via OROMUCOSAL
  Filled 2015-09-21: qty 15

## 2015-09-21 MED ORDER — HYDROMORPHONE HCL 1 MG/ML IJ SOLN: 1.0000 mg | INTRAMUSCULAR | Status: DC | PRN

## 2015-09-21 MED ORDER — CHLORHEXIDINE GLUCONATE 0.12 % MT SOLN
15.0000 mL | Freq: Two times a day (BID) | OROMUCOSAL | Status: DC
  Administered 2015-09-21: 15 mL via OROMUCOSAL

## 2015-09-21 MED ORDER — CETYLPYRIDINIUM CHLORIDE 0.05 % MT LIQD
7.0000 mL | Freq: Two times a day (BID) | OROMUCOSAL | Status: DC
  Administered 2015-09-21: 7 mL via OROMUCOSAL

## 2015-09-21 MED ORDER — OXYCODONE HCL 5 MG PO TABS
10.0000 mg | ORAL_TABLET | ORAL | Status: DC | PRN
  Administered 2015-09-21: 10 mg via ORAL
  Filled 2015-09-21: qty 2

## 2015-09-21 NOTE — Progress Notes (Signed)
Report given to G[place]arah RN. Patient transferring to [address] with belongings.

## 2015-09-21 NOTE — Progress Notes (Signed)
Attempt to call report.
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
## 2015-09-22 DIAGNOSIS — S0101XA Laceration without foreign body of scalp, initial encounter: Secondary | ICD-10-CM | POA: Diagnosis present

## 2015-09-22 MED ORDER — OXYCODONE-ACETAMINOPHEN 5-325 MG PO TABS: 1.0000 | ORAL_TABLET | ORAL | Status: DC | PRN

## 2015-09-22 NOTE — Discharge Instructions (Signed)
No driving while taking oxycodone.  No running, jumping, ball or contact sports, bikes, skateboards, motorcycles, etc for 6 weeks.
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
## 2016-08-25 ENCOUNTER — Emergency Department (HOSPITAL_COMMUNITY)
Admission: EM | Admit: 2016-08-25 | Discharge: 2016-08-25 | Disposition: A | Discharge status: Home / Self Care | Payer: Self-pay | Attending: Emergency Medicine | Admitting: Emergency Medicine

## 2016-08-25 ENCOUNTER — Encounter (HOSPITAL_COMMUNITY): Payer: Self-pay | Admitting: Emergency Medicine

## 2016-08-25 DIAGNOSIS — J069 Acute upper respiratory infection, unspecified: Secondary | ICD-10-CM | POA: Insufficient documentation

## 2016-08-25 MED ORDER — DM-GUAIFENESIN ER 30-600 MG PO TB12: 1.0000 | ORAL_TABLET | Freq: Two times a day (BID) | ORAL | 0 refills | Status: DC | PRN

## 2016-08-25 MED ORDER — FLUTICASONE PROPIONATE 50 MCG/ACT NA SUSP: 2.0000 | Freq: Every day | NASAL | 2 refills | Status: DC
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
## 2016-08-25 NOTE — Discharge Instructions (Signed)
Please take Mucinex twice a day as needed. Please take Flonase 2 sprays in each nostril a day as needed. Drink at least 8 glasses of wine throughout the day and use her Vicoprofen or naproxen as needed for any pain.  See attached financial resource guide for finding a primary care provider.  Get help right away if: You have severe or persistent: Headache. Ear pain. Sinus pain. Chest pain. You have chronic lung disease and any of the following: Wheezing. Prolonged cough. Coughing up blood. A change in your usual mucus. You have a stiff neck. You have changes in your: Vision. Hearing. Thinking. Mood.

## 2016-08-25 NOTE — ED Triage Notes (Signed)
Chills cough and fever since Tuesday chest congestion

## 2016-12-15 ENCOUNTER — Encounter (HOSPITAL_COMMUNITY): Payer: Self-pay

## 2016-12-15 ENCOUNTER — Emergency Department (HOSPITAL_COMMUNITY): Payer: Self-pay

## 2016-12-15 ENCOUNTER — Emergency Department (HOSPITAL_COMMUNITY)
Admission: EM | Admit: 2016-12-15 | Discharge: 2016-12-15 | Disposition: A | Discharge status: Home / Self Care | Payer: Self-pay | Attending: Emergency Medicine | Admitting: Emergency Medicine

## 2016-12-15 DIAGNOSIS — S62633B Displaced fracture of distal phalanx of left middle finger, initial encounter for open fracture: Secondary | ICD-10-CM | POA: Insufficient documentation

## 2016-12-15 DIAGNOSIS — Z79899 Other long term (current) drug therapy: Secondary | ICD-10-CM | POA: Insufficient documentation

## 2016-12-15 DIAGNOSIS — W231XXA Caught, crushed, jammed, or pinched between stationary objects, initial encounter: Secondary | ICD-10-CM | POA: Insufficient documentation

## 2016-12-15 DIAGNOSIS — Y990 Civilian activity done for income or pay: Secondary | ICD-10-CM | POA: Insufficient documentation

## 2016-12-15 DIAGNOSIS — Y939 Activity, unspecified: Secondary | ICD-10-CM | POA: Insufficient documentation

## 2016-12-15 DIAGNOSIS — Z23 Encounter for immunization: Secondary | ICD-10-CM | POA: Insufficient documentation

## 2016-12-15 DIAGNOSIS — Y9289 Other specified places as the place of occurrence of the external cause: Secondary | ICD-10-CM | POA: Insufficient documentation

## 2016-12-15 DIAGNOSIS — S62639B Displaced fracture of distal phalanx of unspecified finger, initial encounter for open fracture: Secondary | ICD-10-CM

## 2016-12-15 MED ORDER — HYDROCODONE-ACETAMINOPHEN 5-325 MG PO TABS
1.0000 | ORAL_TABLET | ORAL | Status: AC
  Administered 2016-12-15: 1 via ORAL
  Filled 2016-12-15: qty 1

## 2016-12-15 MED ORDER — HYDROCODONE-ACETAMINOPHEN 5-325 MG PO TABS: 1.0000 | ORAL_TABLET | Freq: Four times a day (QID) | ORAL | 0 refills | Status: DC | PRN

## 2016-12-15 MED ORDER — TETANUS-DIPHTH-ACELL PERTUSSIS 5-2.5-18.5 LF-MCG/0.5 IM SUSP
0.5000 mL | Freq: Once | INTRAMUSCULAR | Status: AC
  Administered 2016-12-15: 0.5 mL via INTRAMUSCULAR
  Filled 2016-12-15: qty 0.5

## 2016-12-15 MED ORDER — NAPROXEN 500 MG PO TABS: 500.0000 mg | ORAL_TABLET | Freq: Two times a day (BID) | ORAL | 0 refills | Status: DC

## 2016-12-15 MED ORDER — CEPHALEXIN 500 MG PO CAPS: 500.0000 mg | ORAL_CAPSULE | Freq: Four times a day (QID) | ORAL | 0 refills | Status: DC

## 2016-12-15 MED ORDER — LIDOCAINE HCL 1 % IJ SOLN
5.0000 mL | Freq: Once | INTRAMUSCULAR | Status: AC
  Administered 2016-12-15: 5 mL
  Filled 2016-12-15: qty 20
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
## 2016-12-15 NOTE — Consult Note (Signed)
Reason for Consult:Left middle finger laceration Referring Physician: Ramiro HarvestJ Knapp  Tyrone Steele is an 31 y.o. male.  HPI: Tyrone Steele was loading wood at work when he got his left middle finger caught between two pieces of the wood. He suffered a bad laceration to his finger and was brought to the ED for evaluation. X-rays showed a distal phalanx fx and hand surgery was consulted.  History reviewed. No pertinent past medical history.  History reviewed. No pertinent surgical history.  History reviewed. No pertinent family history.  Social History:  reports that he has never smoked. He has never used smokeless tobacco. He reports that he does not drink alcohol or use drugs.  Allergies: No Known Allergies  Medications: I have reviewed the patient's current medications.  No results found for this or any previous visit (from the past 48 hour(s)).  Dg Finger Middle Left  Result Date: 12/15/2016 CLINICAL DATA:  Finger laceration.  Initial encounter. EXAM: LEFT MIDDLE FINGER 2+V COMPARISON:  None. FINDINGS: Comminuted tuft fracture of the middle finger with overlying laceration. A 4 mm fragment is notably displaced distally. No dislocation. IMPRESSION: Open, comminuted and displaced tuft fracture. Electronically Signed   By: Marnee SpringJonathon  Watts M.D.   On: 12/15/2016 15:18    Review of Systems  Constitutional: Negative for weight loss.  HENT: Negative for ear discharge, ear pain, hearing loss and tinnitus.   Eyes: Negative for blurred vision, double vision, photophobia and pain.  Respiratory: Negative for cough, sputum production and shortness of breath.   Cardiovascular: Negative for chest pain.  Gastrointestinal: Negative for abdominal pain, nausea and vomiting.  Genitourinary: Negative for dysuria, flank pain, frequency and urgency.  Musculoskeletal: Positive for joint pain (left middle finger). Negative for back pain, falls, myalgias and neck pain.  Neurological: Negative for dizziness, tingling,  sensory change, focal weakness, loss of consciousness and headaches.  Endo/Heme/Allergies: Does not bruise/bleed easily.  Psychiatric/Behavioral: Negative for depression, memory loss and substance abuse. The patient is not nervous/anxious.    Blood pressure 129/74, pulse 80, temperature 98.6 F (37 C), temperature source Oral, resp. rate 16, SpO2 99 %. Physical Exam  Constitutional: He appears well-developed and well-nourished. No distress.  HENT:  Head: Normocephalic.  Eyes: Conjunctivae are normal. Right eye exhibits no discharge. Left eye exhibits no discharge. No scleral icterus.  Cardiovascular: Normal rate and regular rhythm.   Respiratory: Effort normal. No respiratory distress.  Musculoskeletal:  Left shoulder, elbow, wrist, digits- circumferential laceration distal phalanx middle finger through nail bed, TTP, no instability, no blocks to motion  Sens  Ax/R/M/U intact  Mot   Ax/ R/ PIN/ M/ AIN/ U intact  Rad 2+   Neurological: He is alert.  Skin: Skin is warm and dry. He is not diaphoretic.  Psychiatric: He has a normal mood and affect. His behavior is normal.    Assessment/Plan: Open left distal phalanx fx with nail bed laceration -- Nail removed and nail bed reduced and repaired. Remaining skin edges approximated as well as possible. Pt ok to discharge with pain medication and Keflex 500mg  qid x 7d. F/u with Dr. Dion SaucierLandau on Monday.    Freeman CaldronMichael J. Ka Bench, PA-C Orthopedic Surgery [phone] 12/15/2016, 5:41 PM
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
## 2016-12-15 NOTE — Procedures (Signed)
Procedure: Left middle finger nail bed reduction and repair, distal phalanx laceration repair, I&D open fx  Indication: Left middle finger distal phalanx laceration  Surgeon: Charma IgoMichael Jailynne Opperman, PA-C  Assist: Teryl LucyJoshua Landau, MD  Anesthesia: 6ml 1% plain lidocaine as digital block  EBL: Minimal  Complications: None  Findings: After risks/benefits explained patient desires to undergo procedure. Consent obtained and time out performed. Digital block was applied and adequate analgesia verified. Pt was sterilely prepped and draped. Wound was copiously irrigated with 500ml NS under low pressure. Nail was removed with combination of blunt and sharp dissection. Nail bed was reduced and approximated with 6-0 chromic sutures. The skin was then loosely approximated with 4-0 nylon sutures. The finger was dressed with Xeroform, 2x2's, Kerlix, and Coban and splinted. The pt tolerated the procedure well.    Tyrone CaldronMichael J. Graylin Sperling, PA-C Orthopedic Surgery [phone]

## 2017-01-11 NOTE — Progress Notes (Signed)
Procedure: Left middle finger nail bed reduction and repair, distal phalanx laceration repair, I&D open fx  Indication: Left middle finger distal phalanx laceration  Surgeon: Teryl LucyJoshua Abbygale Lapid, MD  Assist: Charma IgoMichael Jeffery, PA-C  Anesthesia: 6ml 1% plain lidocaine as digital block  EBL: Minimal  Complications: None  Findings: After risks/benefits explained patient desires to undergo procedure. Consent obtained and time out performed. Digital block was applied and adequate analgesia verified. Pt was sterilely prepped and draped. Wound was copiously irrigated with 500ml NS under low pressure. Nail was removed with combination of blunt and sharp dissection. Nail bed was reduced and approximated with 6-0 chromic sutures. The skin was then loosely approximated with 4-0 nylon sutures. The finger was dressed with Xeroform, 2x2's, Kerlix, and Coban and splinted. The pt tolerated the procedure well.    Freeman CaldronMichael J. Jeffery, PA-C Orthopedic Surgery [phone]

## 2018-04-17 ENCOUNTER — Encounter (HOSPITAL_COMMUNITY): Payer: Self-pay | Admitting: Family Medicine

## 2018-04-17 ENCOUNTER — Ambulatory Visit (HOSPITAL_COMMUNITY)
Admission: EM | Admit: 2018-04-17 | Discharge: 2018-04-17 | Disposition: A | Discharge status: Home / Self Care | Payer: Self-pay

## 2018-04-17 ENCOUNTER — Other Ambulatory Visit: Payer: Self-pay

## 2018-04-17 DIAGNOSIS — S39012A Strain of muscle, fascia and tendon of lower back, initial encounter: Secondary | ICD-10-CM

## 2018-04-17 DIAGNOSIS — S46812A Strain of other muscles, fascia and tendons at shoulder and upper arm level, left arm, initial encounter: Secondary | ICD-10-CM

## 2018-04-17 MED ORDER — KETOROLAC TROMETHAMINE 30 MG/ML IJ SOLN
INTRAMUSCULAR | Status: AC
  Filled 2018-04-17: qty 1

## 2018-04-17 MED ORDER — NAPROXEN 500 MG PO TABS: 500.0000 mg | ORAL_TABLET | Freq: Two times a day (BID) | ORAL | 0 refills | Status: DC

## 2018-04-17 MED ORDER — KETOROLAC TROMETHAMINE 30 MG/ML IJ SOLN
30.0000 mg | Freq: Once | INTRAMUSCULAR | Status: AC
  Administered 2018-04-17: 30 mg via INTRAMUSCULAR

## 2018-04-17 MED ORDER — CYCLOBENZAPRINE HCL 10 MG PO TABS: 10.0000 mg | ORAL_TABLET | Freq: Two times a day (BID) | ORAL | 0 refills | Status: DC | PRN
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
## 2018-04-17 NOTE — Discharge Instructions (Addendum)
Toradol injection given here for pain. We will send you home with prescriptions for naproxen for pain inflammation and Flexeril for muscle relaxant.  Be aware of the muscle relaxant will make you drowsy Gentle stretching and heat/ice to the area can help Follow up as needed for continued or worsening symptoms
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
## 2018-08-19 ENCOUNTER — Other Ambulatory Visit: Payer: Self-pay

## 2018-08-19 ENCOUNTER — Ambulatory Visit (HOSPITAL_COMMUNITY)
Admission: EM | Admit: 2018-08-19 | Discharge: 2018-08-19 | Disposition: A | Discharge status: Home / Self Care | Payer: Self-pay | Attending: Family Medicine | Admitting: Family Medicine

## 2018-08-19 ENCOUNTER — Encounter (HOSPITAL_COMMUNITY): Payer: Self-pay | Admitting: Emergency Medicine

## 2018-08-19 DIAGNOSIS — K529 Noninfective gastroenteritis and colitis, unspecified: Secondary | ICD-10-CM

## 2018-08-19 DIAGNOSIS — R197 Diarrhea, unspecified: Secondary | ICD-10-CM

## 2018-08-19 DIAGNOSIS — R112 Nausea with vomiting, unspecified: Secondary | ICD-10-CM

## 2018-08-19 MED ORDER — ONDANSETRON HCL 4 MG PO TABS: 4.0000 mg | ORAL_TABLET | Freq: Four times a day (QID) | ORAL | 0 refills | Status: DC

## 2018-08-19 NOTE — ED Triage Notes (Signed)
PT's kids have had a stomach bug. Diarrhea started yesterday. Has vomited once since onset.

## 2018-08-19 NOTE — Discharge Instructions (Signed)

## 2018-08-19 NOTE — ED Provider Notes (Signed)
[facility]   [id number] 08/19/18 Arrival Time: 1035  CC: N/V/D  SUBJECTIVE:  Tyrone Steele is a 33 y.o. male who presents with complaint of nausea, vomiting x 2 episodes, and diarrhea x 3-4 episodes that began abruptly this morning.  Reports close contact to kids with similar symptoms.  Denies abdominal pain.  Has tried diet restrictions with relief.  Has been unable to keep food down, but tolerating liquids without difficulty.  Denies similar symptoms in the past.  Last BM this morning.  Complains of associated decreased appetite.     Denies fever, chills, chest pain, SOB, constipation, hematochezia, melena, dysuria, difficulty urinating, increased frequency or urgency, flank pain, loss of bowel or bladder function.  No LMP for male patient.  ROS: As per HPI.  History reviewed. No pertinent past medical history. History reviewed. No pertinent surgical history. No Known Allergies No current facility-administered medications on file prior to encounter.    Current Outpatient Medications on File Prior to Encounter  Medication Sig Dispense Refill  . ibuprofen (ADVIL,MOTRIN) 200 MG tablet Take 200 mg by mouth every 6 (six) hours as needed.     Social History   Socioeconomic History  . Marital status: Single    Spouse name: Not on file  . Number of children: Not on file  . Years of education: Not on file  . Highest education level: Not on file  Occupational History  . Not on file  Social Needs  . Financial resource strain: Not on file  . Food insecurity:    Worry: Not on file    Inability: Not on file  . Transportation needs:    Medical: Not on file    Non-medical: Not on file  Tobacco Use  . Smoking status: Never Smoker  . Smokeless tobacco: Never Used  Substance and Sexual Activity  . Alcohol use: No  . Drug use: No  . Sexual activity: Not on file  Lifestyle  . Physical activity:    Days per week: Not on file    Minutes per session: Not on file  .  Stress: Not on file  Relationships  . Social connections:    Talks on phone: Not on file    Gets together: Not on file    Attends religious service: Not on file    Active member of club or organization: Not on file    Attends meetings of clubs or organizations: Not on file    Relationship status: Not on file  . Intimate partner violence:    Fear of current or ex partner: Not on file    Emotionally abused: Not on file    Physically abused: Not on file    Forced sexual activity: Not on file  Other Topics Concern  . Not on file  Social History Narrative  . Not on file   No family history on file.   OBJECTIVE:  Vitals:   08/19/18 1136  BP: 120/81  Pulse: 76  Resp: 16  Temp: 98.1 F (36.7 C)  TempSrc: Temporal  SpO2: 98%    General appearance: Alert; NAD HEENT: NCAT.  Oropharynx clear.  Lungs: clear to auscultation bilaterally without adventitious breath sounds Heart: regular rate and rhythm.  Radial pulses 2+ symmetrical bilaterally Abdomen: soft, non-distended; normal active bowel sounds; non-tender to light and deep palpation; nontender at McBurney's point; negative Murphy's sign; no guarding Back: no CVA tenderness Extremities: no edema; symmetrical with no gross deformities Skin: warm and dry Neurologic: normal gait Psychological:  alert and cooperative; normal mood and affect  ASSESSMENT & PLAN:  1. Nausea vomiting and diarrhea   2. Gastroenteritis     Meds ordered this encounter  Medications  . ondansetron (ZOFRAN) 4 MG tablet    Sig: Take 1 tablet (4 mg total) by mouth every 6 (six) hours.    Dispense:  12 tablet    Refill:  0    Order Specific Question:   Supervising Provider    Answer:   Eustace Moore [[id number]]    Get rest and drink fluids Zofran prescribed.  Take as directed.    DIET Instructions:  30 minutes after taking nausea medicine, begin with sips of clear liquids. If able to hold down 2 - 4 ounces for 30 minutes, begin drinking  more. Increase your fluid intake to replace losses. Clear liquids only for 24 hours (water, tea, sport drinks, clear flat ginger ale or cola and juices, broth, jello, popsicles, ect). Advance to bland foods, applesauce, rice, baked or boiled chicken, ect. Avoid milk, greasy foods and anything that doesn't agree with you.  If you experience new or worsening symptoms return or go to ER such as fever, chills, nausea, vomiting, diarrhea, bloody or dark tarry stools, constipation, urinary symptoms, worsening abdominal discomfort, symptoms that do not improve with medications, inability to keep fluids down, etc...  Reviewed expectations re: course of current medical issues. Questions answered. Outlined signs and symptoms indicating need for more acute intervention. Patient verbalized understanding. After Visit Summary given.   Rennis Harding, PA-C 08/19/18 1507

## 2018-11-21 ENCOUNTER — Ambulatory Visit (HOSPITAL_COMMUNITY)
Admission: EM | Admit: 2018-11-21 | Discharge: 2018-11-21 | Disposition: A | Discharge status: Home / Self Care | Payer: Self-pay | Attending: Family Medicine | Admitting: Family Medicine

## 2018-11-21 ENCOUNTER — Other Ambulatory Visit: Payer: Self-pay

## 2018-11-21 ENCOUNTER — Encounter (HOSPITAL_COMMUNITY): Payer: Self-pay | Admitting: Emergency Medicine

## 2018-11-21 DIAGNOSIS — K047 Periapical abscess without sinus: Secondary | ICD-10-CM

## 2018-11-21 DIAGNOSIS — K0889 Other specified disorders of teeth and supporting structures: Secondary | ICD-10-CM

## 2018-11-21 MED ORDER — MELOXICAM 7.5 MG PO TABS: 7.5000 mg | ORAL_TABLET | Freq: Every day | ORAL | 0 refills | Status: DC

## 2018-11-21 MED ORDER — AMOXICILLIN-POT CLAVULANATE 875-125 MG PO TABS: 1.0000 | ORAL_TABLET | Freq: Two times a day (BID) | ORAL | 0 refills | Status: AC

## 2018-11-21 NOTE — Discharge Instructions (Signed)
Augmentin prescribed for possible infection.   Mobic prescribed.  Take as directed for pain.  Avoid taking with other antiinflammatories as this may cause GI upset or GI bleed Recommend soft diet until evaluated by dentist Maintain oral hygiene care Follow up with dentist as soon as possible for further evaluation and treatment  Return or go to the ED if you have any new or worsening symptoms such as fever, chills, difficulty swallowing, painful swallowing, oral or neck swelling, nausea, vomiting, chest pain, SOB, etc..Marland Kitchen

## 2018-11-21 NOTE — ED Provider Notes (Signed)
[facility]   [id number] 11/21/18 Arrival Time: 0809  CC: DENTAL PAIN  SUBJECTIVE:  Tyrone Steele is a 33 y.o. male who presents with worsening dental pain x 4 days ago.  Denies a precipitating event or trauma.  Localizes pain to LT top gum.  Has tried OTC analgesics without relief.  Worse with chewing.  Does not see a dentist regularly.  Denies similar symptoms in the past.  Denies fever, chills, dysphagia, odynophagia, oral or neck swelling, nausea, vomiting, chest pain, SOB.    ROS: As per HPI.  History reviewed. No pertinent past medical history. History reviewed. No pertinent surgical history. No Known Allergies No current facility-administered medications on file prior to encounter.    Current Outpatient Medications on File Prior to Encounter  Medication Sig Dispense Refill  . ibuprofen (ADVIL,MOTRIN) 200 MG tablet Take 200 mg by mouth every 6 (six) hours as needed.     Social History   Socioeconomic History  . Marital status: Single    Spouse name: Not on file  . Number of children: Not on file  . Years of education: Not on file  . Highest education level: Not on file  Occupational History  . Not on file  Social Needs  . Financial resource strain: Not on file  . Food insecurity:    Worry: Not on file    Inability: Not on file  . Transportation needs:    Medical: Not on file    Non-medical: Not on file  Tobacco Use  . Smoking status: Never Smoker  . Smokeless tobacco: Never Used  Substance and Sexual Activity  . Alcohol use: No  . Drug use: No  . Sexual activity: Not on file  Lifestyle  . Physical activity:    Days per week: Not on file    Minutes per session: Not on file  . Stress: Not on file  Relationships  . Social connections:    Talks on phone: Not on file    Gets together: Not on file    Attends religious service: Not on file    Active member of club or organization: Not on file    Attends meetings of clubs or organizations: Not on  file    Relationship status: Not on file  . Intimate partner violence:    Fear of current or ex partner: Not on file    Emotionally abused: Not on file    Physically abused: Not on file    Forced sexual activity: Not on file  Other Topics Concern  . Not on file  Social History Narrative  . Not on file   History reviewed. No pertinent family history.  OBJECTIVE:  Vitals:   11/21/18 0821  BP: 126/75  Pulse: 62  Resp: 16  Temp: 97.8 F (36.6 C)  TempSrc: Oral  SpO2: 98%    General appearance: alert; no distress HENT: normocephalic; atraumatic; dentition: fair; poor over right upper gums without areas of fluctuance; partial tooth avulsion, number 16 Neck: supple without LAD Lungs: normal respirations Skin: warm and dry Psychological: alert and cooperative; normal mood and affect  ASSESSMENT & PLAN:  1. Pain, dental   2. Dental infection     Meds ordered this encounter  Medications  . amoxicillin-clavulanate (AUGMENTIN) 875-125 MG tablet    Sig: Take 1 tablet by mouth every 12 (twelve) hours for 10 days.    Dispense:  20 tablet    Refill:  0    Order Specific Question:  Supervising Provider    Answer:   Eustace MooreNELSON, YVONNE SUE [[id number]]  . meloxicam (MOBIC) 7.5 MG tablet    Sig: Take 1 tablet (7.5 mg total) by mouth daily.    Dispense:  20 tablet    Refill:  0    Order Specific Question:   Supervising Provider    Answer:   Eustace MooreELSON, YVONNE SUE [[id number]]   Augmentin prescribed for possible infection.   Mobic prescribed.  Take as directed for pain.  Avoid taking with other antiinflammatories as this may cause GI upset or GI bleed Recommend soft diet until evaluated by dentist Maintain oral hygiene care Follow up with dentist as soon as possible for further evaluation and treatment  Return or go to the ED if you have any new or worsening symptoms such as fever, chills, difficulty swallowing, painful swallowing, oral or neck swelling, nausea, vomiting, chest pain, SOB,  etc...  Reviewed expectations re: course of current medical issues. Questions answered. Outlined signs and symptoms indicating need for more acute intervention. Patient verbalized understanding. After Visit Summary given.   Alvino ChapelWurst, [place]oniaBritt[place], PA-C 11/21/18 [phone]

## 2018-11-21 NOTE — ED Triage Notes (Signed)
Pt presents to [facility] for assessment of left lower dental pain since Saturday.  Pt states it has been worsening since it started, with intermittent swelling.

## 2018-11-21 NOTE — ED Notes (Signed)
Patient able to ambulate independently  

## 2018-12-26 ENCOUNTER — Other Ambulatory Visit: Payer: Self-pay

## 2018-12-26 ENCOUNTER — Ambulatory Visit (HOSPITAL_COMMUNITY)
Admission: EM | Admit: 2018-12-26 | Discharge: 2018-12-26 | Disposition: A | Discharge status: Home / Self Care | Payer: Self-pay | Attending: Family Medicine | Admitting: Family Medicine

## 2018-12-26 ENCOUNTER — Encounter (HOSPITAL_COMMUNITY): Payer: Self-pay | Admitting: Emergency Medicine

## 2018-12-26 DIAGNOSIS — G629 Polyneuropathy, unspecified: Secondary | ICD-10-CM | POA: Insufficient documentation

## 2018-12-26 DIAGNOSIS — E1165 Type 2 diabetes mellitus with hyperglycemia: Secondary | ICD-10-CM | POA: Insufficient documentation

## 2018-12-26 LAB — POCT URINALYSIS DIP (DEVICE)
Bilirubin Urine: NEGATIVE
Glucose, UA: 500 mg/dL — AB
Hgb urine dipstick: NEGATIVE
Ketones, ur: NEGATIVE mg/dL
Leukocytes,Ua: NEGATIVE
Nitrite: NEGATIVE
Protein, ur: NEGATIVE mg/dL
Specific Gravity, Urine: 1.015 (ref 1.005–1.030)
Urobilinogen, UA: 0.2 mg/dL (ref 0.0–1.0)
pH: 5.5 (ref 5.0–8.0)

## 2018-12-26 LAB — CBC
HCT: 43.4 % (ref 39.0–52.0)
Hemoglobin: 14.3 g/dL (ref 13.0–17.0)
MCH: 26 pg (ref 26.0–34.0)
MCHC: 32.9 g/dL (ref 30.0–36.0)
MCV: 78.9 fL — ABNORMAL LOW (ref 80.0–100.0)
Platelets: 196 10*3/uL (ref 150–400)
RBC: 5.5 MIL/uL (ref 4.22–5.81)
RDW: 13.7 % (ref 11.5–15.5)
WBC: 5.5 10*3/uL (ref 4.0–10.5)
nRBC: 0 % (ref 0.0–0.2)

## 2018-12-26 LAB — COMPREHENSIVE METABOLIC PANEL
ALT: 36 U/L (ref 0–44)
AST: 23 U/L (ref 15–41)
Albumin: 4 g/dL (ref 3.5–5.0)
Alkaline Phosphatase: 82 U/L (ref 38–126)
Anion gap: 11 (ref 5–15)
BUN: 10 mg/dL (ref 6–20)
CO2: 25 mmol/L (ref 22–32)
Calcium: 10.1 mg/dL (ref 8.9–10.3)
Chloride: 99 mmol/L (ref 98–111)
Creatinine, Ser: 0.99 mg/dL (ref 0.61–1.24)
GFR calc Af Amer: >60 mL/min (ref >60)
GFR calc non Af Amer: >60 mL/min (ref >60)
Glucose, Bld: 379 mg/dL — ABNORMAL HIGH (ref 70–99)
Potassium: 4.1 mmol/L (ref 3.5–5.1)
Sodium: 135 mmol/L (ref 135–145)
Total Bilirubin: 1 mg/dL (ref 0.3–1.2)
Total Protein: 7.4 g/dL (ref 6.5–8.1)

## 2018-12-26 LAB — GLUCOSE, CAPILLARY: Glucose-Capillary: 406 mg/dL — ABNORMAL HIGH (ref 70–99)

## 2018-12-26 MED ORDER — BLOOD GLUCOSE MONITOR KIT: PACK | 0 refills | Status: DC

## 2018-12-26 MED ORDER — METFORMIN HCL 500 MG PO TABS: 500.0000 mg | ORAL_TABLET | Freq: Two times a day (BID) | ORAL | 0 refills | Status: DC

## 2018-12-26 NOTE — ED Triage Notes (Signed)
Pt presents to [facility] for assessment of bilateral foot tingling since the beginning of the year.  States it is all the time.  States he tried athlete foot spray without relief.  Hx of diabetes in his family.
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
## 2018-12-26 NOTE — Discharge Instructions (Signed)
Your blood sugar indicates new onset of Diabetes.  I will notify you if any of your other labs are concerning.  Please start metformin twice a day. May start this once a day to start with and advance to twice a day. Can cause stomach upset/diarrhea.  Please pick up a glucometer so you can start monitoring your blood sugar.  Please see provided education about Diabetes and nutrition and exercise.  Please call to establish with a primary care provider for further management in the next 2 weeks.

## 2018-12-27 ENCOUNTER — Telehealth (HOSPITAL_COMMUNITY): Payer: Self-pay | Admitting: Emergency Medicine

## 2018-12-27 MED FILL — !TRUE METRIX BLOOD GLUCOSE: 30 days supply | Qty: 1 | Fill #0

## 2018-12-27 MED FILL — TRUE METRIX TEST STRIP: 25 days supply | Qty: 100 | Fill #0

## 2018-12-27 MED FILL — TRUEplus LANCETS 28G MISC: 25 days supply | Qty: 100 | Fill #0

## 2018-12-27 NOTE — Telephone Encounter (Signed)
Patient came to department requesting a work note change.  Dr hagler reviewed record and generated a work note.    Patient requested his wife be allowed to pick up note-lafonda brown.    Patient requested to limit work to 4-6 hours at a time.  Patient requesting no long periods of standing.    Patient reports he has started medicine  W[facility] reports [facility]  Lourena Simmonds did come to get work note for patient

## 2019-01-03 ENCOUNTER — Other Ambulatory Visit: Payer: Self-pay

## 2019-01-03 ENCOUNTER — Inpatient Hospital Stay: Payer: Self-pay | Admitting: Primary Care

## 2019-01-03 ENCOUNTER — Ambulatory Visit: Payer: Self-pay | Attending: Family Medicine | Admitting: Family Medicine

## 2019-01-03 ENCOUNTER — Encounter: Payer: Self-pay | Admitting: Family Medicine

## 2019-01-03 VITALS — BP 117/79 | HR 82 | Temp 98.1°F | Wt 193.4 lb

## 2019-01-03 DIAGNOSIS — E1149 Type 2 diabetes mellitus with other diabetic neurological complication: Secondary | ICD-10-CM

## 2019-01-03 DIAGNOSIS — E119 Type 2 diabetes mellitus without complications: Secondary | ICD-10-CM | POA: Insufficient documentation

## 2019-01-03 DIAGNOSIS — E1169 Type 2 diabetes mellitus with other specified complication: Secondary | ICD-10-CM | POA: Insufficient documentation

## 2019-01-03 DIAGNOSIS — E1165 Type 2 diabetes mellitus with hyperglycemia: Secondary | ICD-10-CM | POA: Insufficient documentation

## 2019-01-03 LAB — POCT GLYCOSYLATED HEMOGLOBIN (HGB A1C): HbA1c POC (<> result, manual entry): >15 % (ref 4.0–5.6)

## 2019-01-03 LAB — GLUCOSE, POCT (MANUAL RESULT ENTRY): POC Glucose: 218 mg/dl — AB (ref 70–99)

## 2019-01-03 MED ORDER — METFORMIN HCL 500 MG PO TABS: 1000.0000 mg | ORAL_TABLET | Freq: Two times a day (BID) | ORAL | 3 refills | Status: DC

## 2019-01-03 MED ORDER — GLIPIZIDE 5 MG PO TABS: 5.0000 mg | ORAL_TABLET | Freq: Two times a day (BID) | ORAL | 3 refills | Status: DC

## 2019-01-03 MED ORDER — GABAPENTIN 300 MG PO CAPS: 300.0000 mg | ORAL_CAPSULE | Freq: Every day | ORAL | 3 refills | Status: DC

## 2019-01-03 MED FILL — metFORMIN HCL 500 MG TABS: 500 | 30 days supply | Qty: 120 | Fill #0

## 2019-01-03 MED FILL — glipiZIDE 5 MG TABS: 5 | 30 days supply | Qty: 60 | Fill #0

## 2019-01-03 MED FILL — GABAPENTIN 300 MG CAPSULE: 300 | 30 days supply | Qty: 30 | Fill #0

## 2019-01-03 NOTE — Progress Notes (Signed)
Subjective:  Patient ID: Tyrone Steele, male    DOB: [birth year]  Age: 33 y.o. MRN: [id number]  CC: Diabetes   HPI Tyrone Steele is a 33 year old male with newly diagnosed type 2 diabetes mellitus which was diagnosed after he presented to the UC on 12/26/2018 with neuropathy in his feet and was found to have blood sugar of 406. He was commenced on metformin.  He presents today stating he has been checking his blood sugars with his fasting blood sugar 179 today he has not received any diabetic education but has been compliant with metformin.  He has noticed some changes in his vision but no excessive thirst or polyuria. 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 Denies chest pain, dyspnea.  Past Medical History:  Diagnosis Date  . Anemia   . Diabetes mellitus without complication [facility])     Past Surgical History:  Procedure Laterality Date  . NO PAST SURGERIES      Family History  Problem Relation Age of Onset  . Diabetes Mother   . Hypertension Father   . Diabetes Father     No Known Allergies  Outpatient Medications Prior to Visit  Medication Sig Dispense Refill  . blood glucose meter kit and supplies KIT Dispense based on patient and insurance preference. Use up to four times daily as directed. (FOR ICD-9 250.00, 250.01). 1 each 0  . metFORMIN (GLUCOPHAGE) 500 MG tablet Take 1 tablet (500 mg total) by mouth 2 (two) times daily with a meal. 60 tablet 0  . ibuprofen (ADVIL,MOTRIN) 200 MG tablet Take 200 mg by mouth every 6 (six) hours as needed.    . meloxicam (MOBIC) 7.5 MG tablet Take 1 tablet (7.5 mg total) by mouth daily. (Patient not taking: Reported on 01/03/2019) 20 tablet 0   No facility-administered medications prior to visit.      ROS Review of Systems  Constitutional: Negative for  activity change and appetite change.  HENT: Negative for sinus pressure and sore throat.   Eyes: Negative for visual disturbance.  Respiratory: Negative for cough, chest tightness and shortness of breath.   Cardiovascular: Negative for chest pain and leg swelling.  Gastrointestinal: Negative for abdominal distention, abdominal pain, constipation and diarrhea.  Endocrine: Negative.   Genitourinary: Negative for dysuria.  Musculoskeletal: Negative for joint swelling and myalgias.  Skin: Negative for rash.  Allergic/Immunologic: Negative.   Neurological: Negative for weakness, light-headedness and numbness.  Psychiatric/Behavioral: Negative for dysphoric mood and suicidal ideas.    Objective:  BP 117/79   Pulse 82   Temp 98.1 F (36.7 C) (Oral)   Wt 193 lb 6.4 oz (87.7 kg)   SpO2 96%   BMI 28.56 kg/m   BP/Weight 01/03/2019 4/92/0100 01/16/2196  Systolic BP 588 325 498  Diastolic BP 79 95 75  Wt. (Lbs) 193.4 - -  BMI 28.56 - -      Physical Exam Constitutional:      Appearance: He is well-developed.  Cardiovascular:     Rate and Rhythm: Normal rate.     Heart sounds: Normal heart sounds. No murmur.  Pulmonary:     Effort: Pulmonary effort is normal.     Breath sounds: Normal breath sounds. No wheezing or rales.  Chest:     Chest wall: No tenderness.  Abdominal:  General: Bowel sounds are normal. There is no distension.     Palpations: Abdomen is soft. There is no mass.     Tenderness: There is no abdominal tenderness.  Musculoskeletal: Normal range of motion.  Neurological:     Mental Status: He is alert and oriented to person, place, and time.     CMP Latest Ref Rng & Units 12/26/2018 09/21/2015 09/20/2015  Glucose 70 - 99 mg/dL 379(H) 109(H) 116(H)  BUN 6 - 20 mg/dL _0 Creatinine 0.61 - 1.24 mg/dL 0.99 1.09 1.25(H)  Sodium 135 - 145 mmol/L 135 139 139  Potassium 3.5 - 5.1 mmol/L 4.1 3.7 4.0  Chloride 98 - 111 mmol/L 99 108 106  CO2 22 - 32 mmol/L _1 Calcium 8.9 - 10.3 mg/dL 10.1 8.4(L) 8.9  Total Protein 6.5 - 8.1 g/dL 7.4 - 7.0  Total Bilirubin 0.3 - 1.2 mg/dL 1.0 - 0.6  Alkaline Phos 38 - 126 U/L 82 - 44  AST 15 - 41 U/L 23 - 113(H)  ALT 0 - 44 U/L 36 - 118(H)    Lipid Panel  No results found for: CHOL, TRIG, HDL, CHOLHDL, VLDL, LDLCALC, LDLDIRECT  CBC    Component Value Date/Time   WBC 5.5 12/26/2018 1500   RBC 5.50 12/26/2018 1500   HGB 14.3 12/26/2018 1500   HCT 43.4 12/26/2018 1500   PLT 196 12/26/2018 1500   MCV 78.9 (L) 12/26/2018 1500   MCH 26.0 12/26/2018 1500   MCHC 32.9 12/26/2018 1500   RDW 13.7 12/26/2018 1500   LYMPHSABS 1.0 09/20/2015 0255   MONOABS 1.0 09/20/2015 0255   EOSABS 0.1 09/20/2015 0255   BASOSABS 0.0 09/20/2015 0255    Lab Results  Component Value Date   HGBA1C >15 01/03/2019    Assessment & Plan:   1. Type 2 diabetes mellitus with other neurologic complication, without long-term current use of insulin (HCC) Newly diagnosed Uncontrolled with A1c of greater than 15 Commenced on gabapentin for diabetic neuropathy  I have provided a note for his place of work to limit his number of hours due to worsening neuropathy from prolonged standing - POCT glucose (manual entry) - POCT glycosylated hemoglobin (Hb A1C) - Microalbumin / creatinine urine ratio - gabapentin (NEURONTIN) 300 MG capsule; Take 1 capsule (300 mg total) by mouth at bedtime.  Dispense: 30 capsule; Refill: 3  2. Type 2 diabetes mellitus with hyperglycemia, without long-term current use of insulin (HCC) Uncontrolled Increased dose of metformin and glipizide has been added He will be scheduled for diabetic teaching with the clinical pharmacist Counseled on Diabetic diet, my plate method, 174 minutes of moderate intensity exercise/week Keep blood sugar logs with fasting goals of 80-120 mg/dl, random of less than 180 and in the event of sugars less than 60 mg/dl or greater than 400 mg/dl please notify the clinic ASAP. It is  recommended that you undergo annual eye exams and annual foot exams. Pneumonia vaccine is recommended. - Microalbumin / creatinine urine ratio - glipiZIDE (GLUCOTROL) 5 MG tablet; Take 1 tablet (5 mg total) by mouth 2 (two) times daily before a meal.  Dispense: 60 tablet; Refill: 3 - metFORMIN (GLUCOPHAGE) 500 MG tablet; Take 2 tablets (1,000 mg total) by mouth 2 (two) times daily with a meal.  Dispense: 120 tablet; Refill: 3   Meds ordered this encounter  Medications  . DISCONTD: metFORMIN (GLUCOPHAGE) 500 MG tablet    Sig: Take 2 tablets (1,000 mg total) by mouth 2 (  two) times daily with a meal.    Dispense:  120 tablet    Refill:  3  . DISCONTD: gabapentin (NEURONTIN) 300 MG capsule    Sig: Take 1 capsule (300 mg total) by mouth at bedtime.    Dispense:  30 capsule    Refill:  3  . DISCONTD: glipiZIDE (GLUCOTROL) 5 MG tablet    Sig: Take 1 tablet (5 mg total) by mouth 2 (two) times daily before a meal.    Dispense:  60 tablet    Refill:  3  . gabapentin (NEURONTIN) 300 MG capsule    Sig: Take 1 capsule (300 mg total) by mouth at bedtime.    Dispense:  30 capsule    Refill:  3  . glipiZIDE (GLUCOTROL) 5 MG tablet    Sig: Take 1 tablet (5 mg total) by mouth 2 (two) times daily before a meal.    Dispense:  60 tablet    Refill:  3  . metFORMIN (GLUCOPHAGE) 500 MG tablet    Sig: Take 2 tablets (1,000 mg total) by mouth 2 (two) times daily with a meal.    Dispense:  120 tablet    Refill:  3    Follow-up: Return in about 3 months (around 04/05/2019) for medical conditions.       Charlott Rakes, MD, FAAFP. [facility] and [place], [place]   01/03/2019, 1:21 PM

## 2019-01-03 NOTE — Patient Instructions (Signed)
Diabetic Neuropathy  Diabetic neuropathy refers to nerve damage that is caused by diabetes (diabetes mellitus). Over time, people with diabetes can develop nerve damage throughout the body. There are several types of diabetic neuropathy:  · Peripheral neuropathy. This is the most common type of diabetic neuropathy. It causes damage to nerves that carry signals between the spinal cord and other parts of the body (peripheral nerves). This usually affects nerves in the feet and legs first, and may eventually affect the hands and arms. The damage affects the ability to sense touch or temperature.  · Autonomic neuropathy. This type causes damage to nerves that control involuntary functions (autonomic nerves). These nerves carry signals that control:  ? Heartbeat.  ? Body temperature.  ? Blood pressure.  ? Urination.  ? Digestion.  ? Sweating.  ? Sexual function.  ? Response to changing blood sugar (glucose) levels.  · Focal neuropathy. This type of nerve damage affects one area of the body, such as an arm, a leg, or the face. The injury may involve one nerve or a small group of nerves. Focal neuropathy can be painful and unpredictable, and occurs most often in older adults with diabetes. This often develops suddenly, but usually improves over time and does not cause long-term problems.  · Proximal neuropathy. This type of nerve damage affects the nerves of the thighs, hips, buttocks, or legs. It causes severe pain, weakness, and muscle death (atrophy), usually in the thigh muscles. It is more common among older men and people who have type 2 diabetes. The length of recovery time may vary.  What are the causes?  Peripheral, autonomic, and focal neuropathies are caused by diabetes that is not well controlled with treatment. The cause of proximal neuropathy is not known, but it may be caused by inflammation related to uncontrolled blood glucose levels.  What are the signs or symptoms?  Peripheral neuropathy  Peripheral  neuropathy develops slowly over time. When the nerves of the feet and legs no longer work, you may experience:  · Burning, stabbing, or aching pain in the legs or feet.  · Pain or cramping in the legs or feet.  · Loss of feeling (numbness) and inability to feel pressure or pain in the feet. This can lead to:  ? Thick calluses or sores on areas of constant pressure.  ? Ulcers.  ? Reduced ability to feel temperature changes.  · Foot deformities.  · Muscle weakness.  · Loss of balance or coordination.  Autonomic neuropathy  The symptoms of autonomic neuropathy vary depending on which nerves are affected. Symptoms may include:  · Problems with digestion, such as:  ? Nausea or vomiting.  ? Poor appetite.  ? Bloating.  ? Diarrhea or constipation.  ? Trouble swallowing.  ? Losing weight without trying to.  · Problems with the heart, blood and lungs, such as:  ? Dizziness, especially when standing up.  ? Fainting.  ? Shortness of breath.  ? Irregular heartbeat.  · Bladder problems, such as:  ? Trouble starting or stopping urination.  ? Leaking urine.  ? Trouble emptying the bladder.  ? Urinary tract infections (UTIs).  · Problems with other body functions, such as:  ? Sweat. You may sweat too much or too little.  ? Temperature. You might get hot easily. Or, you might feel cold more than usual.  ? Sexual function. Men may not be able to get or maintain an erection. Women may have vaginal dryness and difficulty with arousal.    Focal neuropathy  Symptoms affect only one area of the body. Common symptoms include:  · Numbness.  · Tingling.  · Burning pain.  · Prickling feeling.  · Very sensitive skin.  · Weakness.  · Inability to move (paralysis).  · Muscle twitching.  · Muscles getting smaller (wasting).  · Poor coordination.  · Double or blurred vision.  Proximal neuropathy  · Sudden, severe pain in the hip, thigh, or buttocks. Pain may spread from the back into the legs (sciatica).  · Pain and numbness in the arms and  legs.  · Tingling.  · Loss of bladder control or bowel control.  · Weakness and wasting of thigh muscles.  · Difficulty getting up from a seated position.  · Abdominal swelling.  · Unexplained weight loss.  How is this diagnosed?  Diagnosis usually involves reviewing your medical history and any symptoms you have. Diagnosis varies depending on the type of neuropathy your health care provider suspects.  Peripheral neuropathy  Your health care provider will check areas that are affected by your nervous system (neurologic exam), such as your reflexes, how you move, and what you can feel. You may have other tests, such as:  · Blood tests.  · Removal and examination of fluid that surrounds the spinal cord (lumbar puncture).  · CT scan.  · MRI.  · A test to check the nerves that control muscles (electromyogram, EMG).  · Tests of how quickly messages pass through your nerves (nerve conduction velocity tests).  · Removal of a small piece of nerve to be examined under a microscope (biopsy).  Autonomic neuropathy  You may have tests, such as:  · Tests to measure your blood pressure and heart rate. This may include monitoring you while you are safely secured to an exam table that moves you from a lying position to an upright position (table tilt test).  · Breathing tests to check your lungs.  · Tests to check how food moves through the digestive system (gastric emptying tests).  · Blood, sweat, or urine tests.  · Ultrasound of your bladder.  · Spinal fluid tests.  Focal neuropathy  This condition may be diagnosed with:  · A neurologic exam.  · CT scan.  · MRI.  · EMG.  · Nerve conduction velocity tests.  Proximal neuropathy  There is no test to diagnose this type of neuropathy. You may have tests to rule out other possible causes of this type of neuropathy. Tests may include:  · X-rays of your spine and lumbar region.  · Lumbar puncture.  · MRI.  How is this treated?  The goal of treatment is to keep nerve damage from getting  worse. The most important part of treatment is keeping your blood glucose level and your A1C level within your target range by following your diabetes management plan. Over time, maintaining lower blood glucose levels helps lessen symptoms. In some cases, you may need prescription pain medicine.  Follow these instructions at home:    Lifestyle    · Do not use any products that contain nicotine or tobacco, such as cigarettes and e-cigarettes. If you need help quitting, ask your health care provider.  · Be physically active every day. Include strength training and balance exercises.  · Follow a healthy meal plan.  · Work with your health care provider to manage your blood pressure.  General instructions  · Follow your diabetes management plan as directed.  ? Check your blood glucose levels as directed   by your health care provider.  ? Keep your blood glucose in your target range as directed by your health care provider.  ? Have your A1C level checked at least two times a year, or as often as told by your health care provider.  · Take over the counter and prescription medicines only as told by your health care provider. This includes insulin and diabetes medicine.  · Do not drive or use heavy machinery while taking prescription pain medicines.  · Check your skin and feet every day for cuts, bruises, redness, blisters, or sores.  · Keep all follow up visits as told by your health care provider. This is important.  Contact a health care provider if:  · You have burning, stabbing, or aching pain in your legs or feet.  · You are unable to feel pressure or pain in your feet.  · You develop problems with digestion, such as:  ? Nausea.  ? Vomiting.  ? Bloating.  ? Constipation.  ? Diarrhea.  ? Abdominal pain.  · You have difficulty with urination, such as inability:  ? To control when you urinate (incontinence).  ? To completely empty the bladder (retention).  · You have palpitations.  · You feel dizzy, weak, or faint when you  stand up.  Get help right away if:  · You cannot urinate.  · You have sudden weakness or loss of coordination.  · You have trouble speaking.  · You have pain or pressure in your chest.  · You have an irregular heart beat.  · You have sudden inability to move a part of your body.  Summary  · Diabetic neuropathy refers to nerve damage that is caused by diabetes. It can affect nerves throughout the entire body, causing numbness and pain in the arms, legs, digestive tract, heart, and other body systems.  · Keep your blood glucose level and your blood pressure in your target range, as directed by your health care provider. This can help prevent neuropathy from getting worse.  · Check your skin and feet every day for cuts, bruises, redness, blisters, or sores.  · Do not use any products that contain nicotine or tobacco, such as cigarettes and e-cigarettes. If you need help quitting, ask your health care provider.  This information is not intended to replace advice given to you by your health care provider. Make sure you discuss any questions you have with your health care provider.  Document Released: 09/12/2001 Document Revised: 08/16/2017 Document Reviewed: 08/08/2016  Elsevier Interactive Patient Education © 2019 [employer]

## 2019-01-04 ENCOUNTER — Encounter: Payer: Self-pay | Admitting: Pharmacist

## 2019-01-04 ENCOUNTER — Encounter: Payer: Self-pay | Admitting: General Practice

## 2019-01-04 ENCOUNTER — Ambulatory Visit: Payer: Self-pay | Attending: Family Medicine | Admitting: Pharmacist

## 2019-01-04 DIAGNOSIS — E1149 Type 2 diabetes mellitus with other diabetic neurological complication: Secondary | ICD-10-CM

## 2019-01-04 LAB — MICROALBUMIN / CREATININE URINE RATIO
Creatinine, Urine: 84.9 mg/dL
Microalb/Creat Ratio: 5 mg/g creat (ref 0–29)
Microalbumin, Urine: 4.6 ug/mL

## 2019-01-04 NOTE — Progress Notes (Signed)
    S:    PCP: Dr. Margarita Rana  No chief complaint on file.  Patient arrives in good spirits.  Presents for diabetes education at the request of Dr. Margarita Rana. Patient was referred yesterday.  Patient reports diabetes was diagnosed 12/26/18 after UC visit. Pt was found to have a sugar of 406 with neuropathy in both feet.   Family/Social History:  - Fhx: DM (mother, father) - Tobacco: never smoker - Alcohol: denies   Human resources officer affordability: self-pay  Patient reports adherence with medications.  Current diabetes medications include: metformin 500 mg - patient takes 2 tablets (1000 mg) BID; glipizide 5 mg BID Anti-hypertensives: none currently  Statin: does not take  Patient denies hypoglycemic events.  Patient reported dietary habits:  - Reports giving up sweets and sugary beverages since diagnosis of diabetes  - Does not limit carbohydrates but is working to increase his intake of fresh fruit and vegetables   Patient-reported exercise habits: denies    Patient denies polyuria or polydipsia.  Patient reports neuropathy. Patient reports visual changes. Patient reports self foot exams.    O:  Physical Exam   ROS   Lab Results  Component Value Date   HGBA1C >15 01/03/2019   There were no vitals filed for this visit.  Lipid Panel  No results found for: CHOL, TRIG, HDL, CHOLHDL, VLDL, LDLCALC, LDLDIRECT  Home fasting CBG: reports 120s-130s over the past 2 days  Clinical ASCVD: No  The ASCVD Risk score Mikey Bussing DC Jr., et al., 2013) failed to calculate for the following reasons:   The 2013 ASCVD risk score is only valid for ages 78 to 49   A/P: Diabetes newly dx currently uncontrolled based on A1c, however, home sugar this morning is improved since addition of glipizide and metformin dose increase. Patient is able to verbalize appropriate hypoglycemia management plan. Patient is adherent with medication. -Encouraged compliance to current medication  regimen.  -Extensively discussed pathophysiology of DM, recommended lifestyle interventions, dietary effects on glycemic control -Counseled on s/sx of and management of hypoglycemia -MyPlate; exercise recommendations given -Next A1C anticipated 03/2019.   HM: Pneumovax recommended. Will defer to PCP.   Written patient instructions provided.  Total time in face to face counseling 30 minutes.   Follow up w/ PCP.   Patient seen with: Jonathon Bellows PharmD Candidate [phone] [place], PharmD, [place] [phone]

## 2019-01-28 MED FILL — GABAPENTIN 300 MG CAPSULE: 300 | 30 days supply | Qty: 30 | Fill #1

## 2019-01-29 ENCOUNTER — Other Ambulatory Visit: Payer: Self-pay

## 2019-01-29 ENCOUNTER — Encounter (HOSPITAL_COMMUNITY): Payer: Self-pay

## 2019-01-29 ENCOUNTER — Ambulatory Visit (HOSPITAL_COMMUNITY)
Admission: EM | Admit: 2019-01-29 | Discharge: 2019-01-29 | Disposition: A | Discharge status: Home / Self Care | Payer: Self-pay | Attending: Family Medicine | Admitting: Family Medicine

## 2019-01-29 DIAGNOSIS — K047 Periapical abscess without sinus: Secondary | ICD-10-CM

## 2019-01-29 MED ORDER — CHLORHEXIDINE GLUCONATE 0.12 % MT SOLN: 15.0000 mL | Freq: Two times a day (BID) | OROMUCOSAL | 0 refills | Status: DC

## 2019-01-29 MED ORDER — PENICILLIN V POTASSIUM 500 MG PO TABS: 500.0000 mg | ORAL_TABLET | Freq: Four times a day (QID) | ORAL | 0 refills | Status: AC

## 2019-01-29 MED ORDER — NAPROXEN 500 MG PO TABS: 500.0000 mg | ORAL_TABLET | Freq: Two times a day (BID) | ORAL | 0 refills | Status: DC

## 2019-01-29 MED FILL — PENICILLIN VK 500 MG TABLET: 500 | 10 days supply | Qty: 40 | Fill #0

## 2019-01-29 MED FILL — CHLORHEXIDINE 0.12% RINSE: 0.12 | 16 days supply | Qty: 473 | Fill #0

## 2019-01-29 MED FILL — NAPROXEN 500 MG TABLET: 500 | 15 days supply | Qty: 30 | Fill #0

## 2019-01-29 NOTE — ED Triage Notes (Signed)
Pt states he has a toothache x 2 days.

## 2019-01-29 NOTE — Discharge Instructions (Addendum)
Treating for dental infection Take medication as prescribed.  Dental resources given
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
## 2019-02-05 MED FILL — glipiZIDE 5 MG TABS: 5 | 30 days supply | Qty: 60 | Fill #1

## 2019-02-08 MED FILL — metFORMIN HCL 500 MG TABS: 500 | 30 days supply | Qty: 120 | Fill #1

## 2019-03-06 ENCOUNTER — Ambulatory Visit (HOSPITAL_COMMUNITY)
Admission: EM | Admit: 2019-03-06 | Discharge: 2019-03-06 | Disposition: A | Discharge status: Home / Self Care | Payer: Self-pay | Attending: Urgent Care | Admitting: Urgent Care

## 2019-03-06 ENCOUNTER — Other Ambulatory Visit: Payer: Self-pay

## 2019-03-06 ENCOUNTER — Encounter (HOSPITAL_COMMUNITY): Payer: Self-pay

## 2019-03-06 DIAGNOSIS — K047 Periapical abscess without sinus: Secondary | ICD-10-CM

## 2019-03-06 DIAGNOSIS — K0889 Other specified disorders of teeth and supporting structures: Secondary | ICD-10-CM

## 2019-03-06 DIAGNOSIS — K029 Dental caries, unspecified: Secondary | ICD-10-CM

## 2019-03-06 MED ORDER — AMOXICILLIN-POT CLAVULANATE 875-125 MG PO TABS: 1.0000 | ORAL_TABLET | Freq: Two times a day (BID) | ORAL | 0 refills | Status: DC

## 2019-03-06 MED ORDER — LIDOCAINE VISCOUS HCL 2 % MT SOLN: OROMUCOSAL | 0 refills | Status: DC

## 2019-03-06 MED ORDER — NAPROXEN 500 MG PO TABS: 500.0000 mg | ORAL_TABLET | Freq: Two times a day (BID) | ORAL | 0 refills | Status: DC

## 2019-03-06 MED FILL — NAPROXEN 500 MG TABLET: 500 | 15 days supply | Qty: 30 | Fill #0

## 2019-03-06 MED FILL — LIDOCAINE 2% VISCOUS SOLN: 2 | 4 days supply | Qty: 200 | Fill #0

## 2019-03-06 MED FILL — GABAPENTIN 300 MG CAPSULE: 300 | 30 days supply | Qty: 30 | Fill #2

## 2019-03-06 MED FILL — AMOX-CLAV 875-125 MG TABLET: 875-125 | 10 days supply | Qty: 20 | Fill #0

## 2019-03-06 NOTE — ED Triage Notes (Signed)
Pt presents with left side dental pain X 2 days. 

## 2019-03-06 NOTE — Discharge Instructions (Addendum)
[facility] [phone] extension [phone]  Dr. Civils [phone] [address]  [facility] [phone] [address]  [facility] [phone] extension [phone] [place], [place], [address] First come first serve for the first 10 clients.  May do simple extractions only, no wisdom teeth or surgery.  You may try the second for Thursday of the month starting at 6:30 AM.  [employer] You may call the school to see if they are still helping to provide dental care for emergent cases.  

## 2019-03-06 NOTE — ED Provider Notes (Signed)
MRN: [id number] DOB: 1985/07/21  Subjective:   Tyrone Steele is a 33 y.o. male presenting for persistent left lower dental pain.  Last office visit was 01/29/2019, was prescribed penicillin, chlorhexidine and naproxen.  He has finished this regimen and still has pain and difficulty chewing.  He has tried to reach out to a couple of different dental practices but cannot afford to be seen.  Patient is severely uncontrolled diabetic with A1c greater than 15 last checked on 01/03/2019.  Reports that he is improving a lot with his diabetes, home blood sugar checks are in the 150s to 180s.   No current facility-administered medications for this encounter.   Current Outpatient Medications:  .  blood glucose meter kit and supplies KIT, Dispense based on patient and insurance preference. Use up to four times daily as directed. (FOR ICD-9 250.00, 250.01)., Disp: 1 each, Rfl: 0 .  chlorhexidine (PERIDEX) 0.12 % solution, Use as directed 15 mLs in the mouth or throat 2 (two) times daily., Disp: 120 mL, Rfl: 0 .  gabapentin (NEURONTIN) 300 MG capsule, Take 1 capsule (300 mg total) by mouth at bedtime., Disp: 30 capsule, Rfl: 3 .  glipiZIDE (GLUCOTROL) 5 MG tablet, Take 1 tablet (5 mg total) by mouth 2 (two) times daily before a meal., Disp: 60 tablet, Rfl: 3 .  ibuprofen (ADVIL,MOTRIN) 200 MG tablet, Take 200 mg by mouth every 6 (six) hours as needed., Disp: , Rfl:  .  metFORMIN (GLUCOPHAGE) 500 MG tablet, Take 2 tablets (1,000 mg total) by mouth 2 (two) times daily with a meal., Disp: 120 tablet, Rfl: 3 .  naproxen (NAPROSYN) 500 MG tablet, Take 1 tablet (500 mg total) by mouth 2 (two) times daily., Disp: 30 tablet, Rfl: 0    No Known Allergies   Past Medical History:  Diagnosis Date  . Anemia   . Diabetes mellitus without complication [facility])      Past Surgical History:  Procedure Laterality Date  . NO PAST SURGERIES      ROS Denies fever, sinus pain, ear pain, throat pain, cough.   Objective:   Vitals: BP (!) 134/92 (BP Location: Right Arm)   Pulse 69   Temp 98.3 F (36.8 C) (Oral)   Resp 18   SpO2 100%   Physical Exam Constitutional:      Appearance: Normal appearance. He is well-developed and normal weight.  HENT:     Head: Normocephalic and atraumatic.      Right Ear: External ear normal.     Left Ear: External ear normal.     Nose: Nose normal.     Mouth/Throat:     Dentition: Gingival swelling and dental caries present.     Pharynx: Oropharynx is clear.   Eyes:     Extraocular Movements: Extraocular movements intact.     Pupils: Pupils are equal, round, and reactive to light.  Cardiovascular:     Rate and Rhythm: Normal rate.  Pulmonary:     Effort: Pulmonary effort is normal.  Neurological:     Mental Status: He is alert and oriented to person, place, and time.  Psychiatric:        Mood and Affect: Mood normal.        Behavior: Behavior normal.        Thought Content: Thought content normal.        Judgment: Judgment normal.    Assessment and Plan :   1. Dental infection   2. Pain, dental   3.  Dental caries     Patient just completed course of penicillin and still has dental pain.  His case is difficult due to being unable to afford dental care.  We will switch to Augmentin and viscous lidocaine, refilled his naproxen.  Provided patient with list of dental practices that may be able to help given his limited resources.  Emphasized need to seek dental care through these practices he is increasing his risk for secondary infections such as C. difficile with the use of multiple antibiotic courses. Counseled patient on potential for adverse effects with medications prescribed/recommended today, ER and return-to-clinic precautions discussed, patient verbalized understanding.    Jaynee Eagles, [place] 03/06/19 [phone]

## 2019-04-01 MED FILL — GABAPENTIN 300 MG CAPSULE: 300 | 30 days supply | Qty: 30 | Fill #2

## 2019-04-01 MED FILL — glipiZIDE 5 MG TABS: 5 | 30 days supply | Qty: 60 | Fill #2

## 2019-04-01 MED FILL — metFORMIN HCL 500 MG TABS: 500 | 30 days supply | Qty: 120 | Fill #2

## 2019-07-24 ENCOUNTER — Other Ambulatory Visit: Payer: Self-pay | Admitting: Cardiology

## 2019-07-24 DIAGNOSIS — Z20822 Contact with and (suspected) exposure to covid-19: Secondary | ICD-10-CM

## 2019-07-25 LAB — NOVEL CORONAVIRUS, NAA: SARS-CoV-2, NAA: NOT DETECTED

## 2019-08-12 ENCOUNTER — Other Ambulatory Visit: Payer: Self-pay | Admitting: Pharmacist

## 2019-08-12 MED ORDER — GLUCOSE BLOOD VI STRP: ORAL_STRIP | 0 refills | Status: DC

## 2019-08-12 MED FILL — metFORMIN HCL 500 MG TABS: 500 | 30 days supply | Qty: 120 | Fill #3

## 2019-08-12 MED FILL — GABAPENTIN 300 MG CAPSULE: 300 | 30 days supply | Qty: 30 | Fill #3

## 2019-08-12 MED FILL — glipiZIDE 5 MG TABS: 5 | 30 days supply | Qty: 60 | Fill #3

## 2019-08-12 MED FILL — TRUE METRIX TEST STRIP: 30 days supply | Qty: 100 | Fill #0

## 2019-09-17 ENCOUNTER — Encounter (HOSPITAL_COMMUNITY): Payer: Self-pay | Admitting: Family Medicine

## 2019-09-17 ENCOUNTER — Ambulatory Visit (INDEPENDENT_AMBULATORY_CARE_PROVIDER_SITE_OTHER): Payer: Self-pay

## 2019-09-17 ENCOUNTER — Other Ambulatory Visit: Payer: Self-pay

## 2019-09-17 ENCOUNTER — Ambulatory Visit (HOSPITAL_COMMUNITY)
Admission: EM | Admit: 2019-09-17 | Discharge: 2019-09-17 | Disposition: A | Discharge status: Home / Self Care | Payer: Self-pay | Attending: Family Medicine | Admitting: Family Medicine

## 2019-09-17 DIAGNOSIS — S6991XA Unspecified injury of right wrist, hand and finger(s), initial encounter: Secondary | ICD-10-CM

## 2019-09-17 MED ORDER — IBUPROFEN 600 MG PO TABS: 600.0000 mg | ORAL_TABLET | Freq: Four times a day (QID) | ORAL | 0 refills | Status: DC | PRN

## 2019-09-17 MED FILL — IBUPROFEN 600 MG TABLET: 600 | 7 days supply | Qty: 30 | Fill #0

## 2019-09-17 NOTE — Discharge Instructions (Addendum)
Your x-ray did not show any fractures of the hand. There is a lot of soft tissue swelling from the injury. I would like you to take ibuprofen 600 mg every 8 hours. This will help with pain and swelling You need to rest the hand and put ice on the hand 3-4 times a day for 10 to 15 minutes at a time. If the swelling increases and the pain worsens over the next couple days you will need to go to the ER.
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
## 2019-09-17 NOTE — ED Triage Notes (Signed)
Pt present right hand injury, pt was working on a car when his hand got closed up in the door.

## 2019-09-19 ENCOUNTER — Encounter (HOSPITAL_COMMUNITY): Payer: Self-pay

## 2019-09-19 ENCOUNTER — Ambulatory Visit (HOSPITAL_COMMUNITY)
Admission: EM | Admit: 2019-09-19 | Discharge: 2019-09-19 | Disposition: A | Discharge status: Home / Self Care | Payer: Self-pay | Attending: Family Medicine | Admitting: Family Medicine

## 2019-09-19 ENCOUNTER — Other Ambulatory Visit: Payer: Self-pay

## 2019-09-19 DIAGNOSIS — S6991XD Unspecified injury of right wrist, hand and finger(s), subsequent encounter: Secondary | ICD-10-CM

## 2019-09-19 MED ORDER — PREDNISONE 10 MG (21) PO TBPK: ORAL_TABLET | ORAL | 0 refills | Status: DC

## 2019-09-19 MED FILL — predniSONE 10 MG TABS: 10 | 6 days supply | Qty: 21 | Fill #0

## 2019-09-19 NOTE — Discharge Instructions (Signed)
Take the prednisone as prescribed. Continue to ice the area and try doing range of motion with the fingers I will give you a contact for hand specialist to follow-up for any continued or worsening problems

## 2019-09-19 NOTE — ED Triage Notes (Signed)
Pt state he right hand pain. Pt state he was just here on Tuesday.  Pt state the pain is worst from Tuesday to now.
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
## 2019-09-25 ENCOUNTER — Other Ambulatory Visit: Payer: Self-pay

## 2019-09-25 ENCOUNTER — Ambulatory Visit: Payer: Self-pay | Attending: Family Medicine | Admitting: Physician Assistant

## 2019-09-25 VITALS — BP 122/73 | HR 99 | Wt 217.0 lb

## 2019-09-25 DIAGNOSIS — Z09 Encounter for follow-up examination after completed treatment for conditions other than malignant neoplasm: Secondary | ICD-10-CM

## 2019-09-25 DIAGNOSIS — E1149 Type 2 diabetes mellitus with other diabetic neurological complication: Secondary | ICD-10-CM

## 2019-09-25 DIAGNOSIS — M79641 Pain in right hand: Secondary | ICD-10-CM

## 2019-09-25 DIAGNOSIS — S60051A Contusion of right little finger without damage to nail, initial encounter: Secondary | ICD-10-CM

## 2019-09-25 DIAGNOSIS — E1165 Type 2 diabetes mellitus with hyperglycemia: Secondary | ICD-10-CM

## 2019-09-25 LAB — POCT GLYCOSYLATED HEMOGLOBIN (HGB A1C): HbA1c, POC (controlled diabetic range): 6.6 % (ref 0.0–7.0)

## 2019-09-25 LAB — GLUCOSE, POCT (MANUAL RESULT ENTRY): POC Glucose: 229 mg/dl — AB (ref 70–99)

## 2019-09-25 MED ORDER — METFORMIN HCL 500 MG PO TABS: 1000.0000 mg | ORAL_TABLET | Freq: Two times a day (BID) | ORAL | 3 refills | Status: DC

## 2019-09-25 MED ORDER — GABAPENTIN 300 MG PO CAPS: 300.0000 mg | ORAL_CAPSULE | Freq: Every day | ORAL | 3 refills | Status: DC

## 2019-09-25 MED ORDER — IBUPROFEN 600 MG PO TABS: 600.0000 mg | ORAL_TABLET | Freq: Four times a day (QID) | ORAL | 0 refills | Status: DC | PRN

## 2019-09-25 MED ORDER — GLIPIZIDE 5 MG PO TABS: 5.0000 mg | ORAL_TABLET | Freq: Two times a day (BID) | ORAL | 3 refills | Status: DC

## 2019-09-25 MED FILL — IBUPROFEN 600 MG TABLET: 600 | 7 days supply | Qty: 30 | Fill #0

## 2019-09-25 MED FILL — metFORMIN HCL 500 MG TABS: 500 | 30 days supply | Qty: 120 | Fill #0

## 2019-09-25 MED FILL — GABAPENTIN 300 MG CAPSULE: 300 | 30 days supply | Qty: 30 | Fill #0

## 2019-09-25 MED FILL — glipiZIDE 5 MG TABS: 5 | 30 days supply | Qty: 60 | Fill #0

## 2019-09-25 NOTE — Progress Notes (Signed)
Tyrone Steele, is a 34 y.o. male  OIZ:[id number]  PJA:[id number]  DOB - Jun 05, 1986  Subjective:  Chief Complaint and HPI: Tyrone Steele is a 34 y.o. male here today to establish after being seen in the ED twice for R hand pain.  Still having pain and swelling.  Xrays are negative.  He is R hand dominant.  Closed the trunk of a car onto his R hand.    Says blood sugar high today bc on prednisone.  Says prior to taking prednisone, blood sugars were <150.    ED/Hospital notes reviewed and grossly summarized above  ROS:   Constitutional:  No f/c, No night sweats, No unexplained weight loss. EENT:  No vision changes, No blurry vision, No hearing changes. No mouth, throat, or ear problems.  Respiratory: No cough, No SOB Cardiac: No CP, no palpitations GI:  No abd pain, No N/V/D. GU: No Urinary s/sx Musculoskeletal: +R hand pain Neuro: No headache, no dizziness, no motor weakness.  Skin: No rash Endocrine:  No polydipsia. No polyuria.  Psych: Denies SI/HI  No problems updated.  ALLERGIES: No Known Allergies  PAST MEDICAL HISTORY: Past Medical History:  Diagnosis Date  . Anemia   . Diabetes mellitus without complication ([place])     MEDICATIONS AT HOME: Prior to Admission medications   Medication Sig Start Date End Date Taking? Authorizing Provider  blood glucose meter kit and supplies KIT Dispense based on patient and insurance preference. Use up to four times daily as directed. (FOR ICD-9 250.00, 250.01). 12/26/18  Yes Burky, Natalie B, NP  gabapentin (NEURONTIN) 300 MG capsule Take 1 capsule (300 mg total) by mouth at bedtime. 09/25/19  Yes Alicea Wente M, PA-C  glipiZIDE (GLUCOTROL) 5 MG tablet Take 1 tablet (5 mg total) by mouth 2 (two) times daily before a meal. 09/25/19  Yes Alizza Sacra M, PA-C  glucose blood test strip Use as instructed 08/12/19  Yes Newlin, Charlane Ferretti, MD  ibuprofen (ADVIL) 600 MG tablet Take 1 tablet (600 mg total) by mouth every 6 (six) hours as  needed. 09/25/19  Yes [name] M, PA-C  metFORMIN (GLUCOPHAGE) 500 MG tablet Take 2 tablets (1,000 mg total) by mouth 2 (two) times daily with a meal. 09/25/19  Yes Mikhala Kenan M, PA-C  predniSONE (STERAPRED UNI-PAK 21 TAB) 10 MG (21) TBPK tablet 6 tabs for 1 day, then 5 tabs for 1 das, then 4 tabs for 1 day, then 3 tabs for 1 day, 2 tabs for 1 day, then 1 tab for 1 day Patient not taking: Reported on 09/25/2019 09/19/19   Loura Halt A, NP     Objective:  EXAM:   Vitals:   09/25/19 1437  BP: 122/73  Pulse: 99  SpO2: 95%  Weight: 217 lb (98.4 kg)    General appearance : A&OX3. NAD. Non-toxic-appearing HEENT: Atraumatic and Normocephalic.  PERRLA. EOM intact.  Chest/Lungs:  Breathing-non-labored, Good air entry bilaterally, breath sounds normal without rales, rhonchi, or wheezing  CVS: S1 S2 regular, no murmurs, gallops, rubs  R hand-small abrasions that are healing at the base of the 4th and 5th digit R hand.  Mild swelling of 3,4,5th fingers.  Tendons and ligaments seem in tact against resistance.  Grip is about 80% of normal Neurology:  CN II-XII grossly intact, Non focal.   Psych:  TP linear. J/I WNL. Normal speech. Appropriate eye contact and affect.  Skin:  No Rash  Data Review Lab Results  Component Value Date   HGBA1C 6.6  09/25/2019   HGBA1C >15 01/03/2019     Assessment & Plan   1. Type 2 diabetes mellitus with hyperglycemia, without long-term current use of insulin (HCC) Uncontrolled but recent prdnisone.  Work on diet and continue current meds - Glucose (CBG) - HgB A1c - metFORMIN (GLUCOPHAGE) 500 MG tablet; Take 2 tablets (1,000 mg total) by mouth 2 (two) times daily with a meal.  Dispense: 120 tablet; Refill: 3 - glipiZIDE (GLUCOTROL) 5 MG tablet; Take 1 tablet (5 mg total) by mouth 2 (two) times daily before a meal.  Dispense: 60 tablet; Refill: 3 - Comprehensive metabolic panel - Lipid panel  2. Contusion of right little finger without damage to nail,  initial encounter - ibuprofen (ADVIL) 600 MG tablet; Take 1 tablet (600 mg total) by mouth every 6 (six) hours as needed.  Dispense: 30 tablet; Refill: 0 - Ambulatory referral to Hand Surgery -I offered light duty note.  I did not approve him to be out of work  3. Right hand pain RICE therapy - ibuprofen (ADVIL) 600 MG tablet; Take 1 tablet (600 mg total) by mouth every 6 (six) hours as needed.  Dispense: 30 tablet; Refill: 0 - Ambulatory referral to Hand Surgery  4. Encounter for examination following treatment at hospital - Ambulatory referral to Hand Surgery  5. Type 2 diabetes mellitus with other neurologic complication, without long-term current use of insulin (HCC) - gabapentin (NEURONTIN) 300 MG capsule; Take 1 capsule (300 mg total) by mouth at bedtime.  Dispense: 30 capsule; Refill: 3   Patient have been counseled extensively about nutrition and exercise  Return in about 3 months (around 12/26/2019) for with PCP.  The patient was given clear instructions to go to ER or return to medical center if symptoms don't improve, worsen or new problems develop. The patient verbalized understanding. The patient was told to call to get lab results if they haven't heard anything in the next week.     [name], PA-C [facility] [place], [place]   09/25/2019, 3:11 PMPatient ID: Tyrone Steele, male   DOB: 1986/04/07, 34 y.o.   MRN: [id number]

## 2019-09-25 NOTE — Patient Instructions (Signed)

## 2019-09-25 NOTE — Progress Notes (Signed)
Patient is having pain in his right hand, states that he can not close the hand and it goes numb at times.

## 2019-09-26 ENCOUNTER — Other Ambulatory Visit: Payer: Self-pay | Admitting: Physician Assistant

## 2019-09-26 DIAGNOSIS — E781 Pure hyperglyceridemia: Secondary | ICD-10-CM

## 2019-09-26 LAB — COMPREHENSIVE METABOLIC PANEL
ALT: 48 IU/L — ABNORMAL HIGH (ref 0–44)
AST: 21 IU/L (ref 0–40)
Albumin/Globulin Ratio: 1.7 (ref 1.2–2.2)
Albumin: 4.5 g/dL (ref 4.0–5.0)
Alkaline Phosphatase: 60 IU/L (ref 39–117)
BUN/Creatinine Ratio: 12 (ref 9–20)
BUN: 15 mg/dL (ref 6–20)
Bilirubin Total: <0.2 mg/dL (ref 0.0–1.2)
CO2: 21 mmol/L (ref 20–29)
Calcium: 9.6 mg/dL (ref 8.7–10.2)
Chloride: 104 mmol/L (ref 96–106)
Creatinine, Ser: 1.22 mg/dL (ref 0.76–1.27)
GFR calc Af Amer: 89 mL/min/{1.73_m2} (ref >59)
GFR calc non Af Amer: 77 mL/min/{1.73_m2} (ref >59)
Globulin, Total: 2.6 g/dL (ref 1.5–4.5)
Glucose: 162 mg/dL — ABNORMAL HIGH (ref 65–99)
Potassium: 3.8 mmol/L (ref 3.5–5.2)
Sodium: 140 mmol/L (ref 134–144)
Total Protein: 7.1 g/dL (ref 6.0–8.5)

## 2019-09-26 LAB — LIPID PANEL
Chol/HDL Ratio: 5.1 ratio — ABNORMAL HIGH (ref 0.0–5.0)
Cholesterol, Total: 194 mg/dL (ref 100–199)
HDL: 38 mg/dL — ABNORMAL LOW (ref >39)
LDL Chol Calc (NIH): 96 mg/dL (ref 0–99)
Triglycerides: 361 mg/dL — ABNORMAL HIGH (ref 0–149)
VLDL Cholesterol Cal: 60 mg/dL — ABNORMAL HIGH (ref 5–40)

## 2019-09-26 MED ORDER — FISH OIL 1000 MG PO CAPS: 3000.0000 mg | ORAL_CAPSULE | Freq: Every day | ORAL | 5 refills | Status: DC

## 2019-09-26 MED FILL — OMEGA-3 ETHYL ESTERS 1 GM C: 1 | 30 days supply | Qty: 90 | Fill #0

## 2020-07-14 ENCOUNTER — Other Ambulatory Visit: Payer: Self-pay | Admitting: Family Medicine

## 2020-07-14 DIAGNOSIS — E1149 Type 2 diabetes mellitus with other diabetic neurological complication: Secondary | ICD-10-CM

## 2020-07-14 DIAGNOSIS — E1165 Type 2 diabetes mellitus with hyperglycemia: Secondary | ICD-10-CM

## 2020-07-14 MED ORDER — GLUCOSE BLOOD VI STRP: ORAL_STRIP | 2 refills | Status: DC

## 2020-07-14 MED ORDER — METFORMIN HCL 500 MG PO TABS: 1000.0000 mg | ORAL_TABLET | Freq: Two times a day (BID) | ORAL | 3 refills | Status: DC

## 2020-07-14 MED ORDER — GABAPENTIN 300 MG PO CAPS: 300.0000 mg | ORAL_CAPSULE | Freq: Every day | ORAL | 3 refills | Status: DC

## 2020-07-14 MED FILL — GABAPENTIN 300 MG CAPSULE: 300 | 30 days supply | Qty: 30 | Fill #0

## 2020-07-14 MED FILL — METFORMIN HCL 500 MG TABS: 500 | 30 days supply | Qty: 120 | Fill #0

## 2020-07-14 MED FILL — TRUE METRIX GLUCOSE TEST ST: 30 days supply | Qty: 100 | Fill #0

## 2020-07-14 NOTE — Telephone Encounter (Signed)
Copied from CRM [id number]. Topic: Quick Communication - Rx Refill/Question >> Jul 14, 2020  2:44 PM Mcneil, Ja-Kwan wrote: Medication: metFORMIN (GLUCOPHAGE) 500 MG tablet, gabapentin (NEURONTIN) 300 MG capsule, and glucose blood test strip  Has the patient contacted their pharmacy? no  Preferred Pharmacy (with phone number or street name): [facility] - [place], [place] - [address]  Phone: [phone]   Fax: [phone]  Agent: Please be advised that RX refills may take up to 3 business days. We ask that you follow-up with your pharmacy.

## 2020-07-24 MED FILL — TRUE METRIX GLUCOSE TEST ST: 30 days supply | Qty: 100 | Fill #0

## 2020-07-24 MED FILL — GABAPENTIN 300 MG CAPSULE: 300 | 30 days supply | Qty: 30 | Fill #0

## 2020-07-24 MED FILL — METFORMIN HCL 500 MG TABS: 500 | 30 days supply | Qty: 120 | Fill #0

## 2020-07-25 ENCOUNTER — Other Ambulatory Visit: Payer: Self-pay

## 2020-07-25 ENCOUNTER — Ambulatory Visit
Admission: EM | Admit: 2020-07-25 | Discharge: 2020-07-25 | Disposition: A | Discharge status: Home / Self Care | Payer: Self-pay | Attending: Emergency Medicine | Admitting: Emergency Medicine

## 2020-07-25 ENCOUNTER — Encounter: Payer: Self-pay | Admitting: Emergency Medicine

## 2020-07-25 DIAGNOSIS — K0889 Other specified disorders of teeth and supporting structures: Secondary | ICD-10-CM

## 2020-07-25 MED ORDER — AMOXICILLIN-POT CLAVULANATE 875-125 MG PO TABS: 1.0000 | ORAL_TABLET | Freq: Two times a day (BID) | ORAL | 0 refills | Status: AC

## 2020-07-25 MED ORDER — AMOXICILLIN-POT CLAVULANATE 875-125 MG PO TABS: 1.0000 | ORAL_TABLET | Freq: Two times a day (BID) | ORAL | 0 refills | Status: DC

## 2020-07-25 MED ORDER — IBUPROFEN 800 MG PO TABS: 800.0000 mg | ORAL_TABLET | Freq: Three times a day (TID) | ORAL | 0 refills | Status: DC

## 2020-07-25 NOTE — ED Triage Notes (Signed)
Pt here with right sided dental pain x 3 days  

## 2020-07-25 NOTE — Discharge Instructions (Addendum)
Augmentin twice daily x 1 week Use anti-inflammatories for pain/swelling. You may take up to 800 mg Ibuprofen every 8 hours with food. You may supplement Ibuprofen with Tylenol [phone] mg every 8 hours.  Warm salt water rinses  Follow-up if not improving or worsening
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
## 2020-08-09 ENCOUNTER — Emergency Department (HOSPITAL_COMMUNITY): Payer: Self-pay

## 2020-08-09 ENCOUNTER — Emergency Department (HOSPITAL_COMMUNITY)
Admission: EM | Admit: 2020-08-09 | Discharge: 2020-08-09 | Disposition: A | Discharge status: Home / Self Care | Payer: Self-pay | Attending: Emergency Medicine | Admitting: Emergency Medicine

## 2020-08-09 ENCOUNTER — Other Ambulatory Visit: Payer: Self-pay

## 2020-08-09 DIAGNOSIS — Z7984 Long term (current) use of oral hypoglycemic drugs: Secondary | ICD-10-CM | POA: Insufficient documentation

## 2020-08-09 DIAGNOSIS — E119 Type 2 diabetes mellitus without complications: Secondary | ICD-10-CM | POA: Insufficient documentation

## 2020-08-09 DIAGNOSIS — R0789 Other chest pain: Secondary | ICD-10-CM

## 2020-08-09 DIAGNOSIS — R110 Nausea: Secondary | ICD-10-CM | POA: Insufficient documentation

## 2020-08-09 DIAGNOSIS — R197 Diarrhea, unspecified: Secondary | ICD-10-CM

## 2020-08-09 DIAGNOSIS — Z20822 Contact with and (suspected) exposure to covid-19: Secondary | ICD-10-CM

## 2020-08-09 DIAGNOSIS — R12 Heartburn: Secondary | ICD-10-CM | POA: Insufficient documentation

## 2020-08-09 LAB — COMPREHENSIVE METABOLIC PANEL
ALT: 42 U/L (ref 0–44)
AST: 23 U/L (ref 15–41)
Albumin: 5.2 g/dL — ABNORMAL HIGH (ref 3.5–5.0)
Alkaline Phosphatase: 58 U/L (ref 38–126)
Anion gap: 12 (ref 5–15)
BUN: 17 mg/dL (ref 6–20)
CO2: 19 mmol/L — ABNORMAL LOW (ref 22–32)
Calcium: 10.1 mg/dL (ref 8.9–10.3)
Chloride: 103 mmol/L (ref 98–111)
Creatinine, Ser: 1.08 mg/dL (ref 0.61–1.24)
GFR, Estimated: >60 mL/min (ref >60)
Glucose, Bld: 202 mg/dL — ABNORMAL HIGH (ref 70–99)
Potassium: 4 mmol/L (ref 3.5–5.1)
Sodium: 134 mmol/L — ABNORMAL LOW (ref 135–145)
Total Bilirubin: 0.9 mg/dL (ref 0.3–1.2)
Total Protein: 9.4 g/dL — ABNORMAL HIGH (ref 6.5–8.1)

## 2020-08-09 LAB — URINALYSIS, ROUTINE W REFLEX MICROSCOPIC
Bilirubin Urine: NEGATIVE
Glucose, UA: NEGATIVE mg/dL
Hgb urine dipstick: NEGATIVE
Ketones, ur: NEGATIVE mg/dL
Leukocytes,Ua: NEGATIVE
Nitrite: NEGATIVE
Protein, ur: 30 mg/dL — AB
Specific Gravity, Urine: 1.029 (ref 1.005–1.030)
pH: 5 (ref 5.0–8.0)

## 2020-08-09 LAB — CBC WITH DIFFERENTIAL/PLATELET
Abs Immature Granulocytes: 0.02 10*3/uL (ref 0.00–0.07)
Basophils Absolute: 0 10*3/uL (ref 0.0–0.1)
Basophils Relative: 0 %
Eosinophils Absolute: 0.4 10*3/uL (ref 0.0–0.5)
Eosinophils Relative: 5 %
HCT: 50.8 % (ref 39.0–52.0)
Hemoglobin: 16.4 g/dL (ref 13.0–17.0)
Immature Granulocytes: 0 %
Lymphocytes Relative: 26 %
Lymphs Abs: 2.3 10*3/uL (ref 0.7–4.0)
MCH: 27.3 pg (ref 26.0–34.0)
MCHC: 32.3 g/dL (ref 30.0–36.0)
MCV: 84.5 fL (ref 80.0–100.0)
Monocytes Absolute: 0.8 10*3/uL (ref 0.1–1.0)
Monocytes Relative: 9 %
Neutro Abs: 5.3 10*3/uL (ref 1.7–7.7)
Neutrophils Relative %: 60 %
Platelets: 214 10*3/uL (ref 150–400)
RBC: 6.01 MIL/uL — ABNORMAL HIGH (ref 4.22–5.81)
RDW: 14 % (ref 11.5–15.5)
WBC: 8.8 10*3/uL (ref 4.0–10.5)
nRBC: 0 % (ref 0.0–0.2)

## 2020-08-09 LAB — TROPONIN I (HIGH SENSITIVITY): Troponin I (High Sensitivity): 3 ng/L (ref <18)

## 2020-08-09 LAB — LIPASE, BLOOD: Lipase: 32 U/L (ref 11–51)

## 2020-08-09 LAB — POC SARS CORONAVIRUS 2 AG -  ED: SARS Coronavirus 2 Ag: NEGATIVE

## 2020-08-09 LAB — SARS CORONAVIRUS 2 (TAT 6-24 HRS): SARS Coronavirus 2: NEGATIVE

## 2020-08-09 MED ORDER — SUCRALFATE 1 GM/10ML PO SUSP: 1.0000 g | Freq: Three times a day (TID) | ORAL | 0 refills | Status: DC

## 2020-08-09 MED ORDER — PANTOPRAZOLE SODIUM 20 MG PO TBEC: 20.0000 mg | DELAYED_RELEASE_TABLET | Freq: Every day | ORAL | 0 refills | Status: DC

## 2020-08-09 MED ORDER — SODIUM CHLORIDE 0.9 % IV BOLUS
1000.0000 mL | Freq: Once | INTRAVENOUS | Status: AC
  Administered 2020-08-09: 1000 mL via INTRAVENOUS

## 2020-08-09 MED ORDER — PANTOPRAZOLE SODIUM 40 MG IV SOLR
40.0000 mg | Freq: Once | INTRAVENOUS | Status: AC
  Administered 2020-08-09: 40 mg via INTRAVENOUS
  Filled 2020-08-09: qty 40

## 2020-08-09 NOTE — ED Triage Notes (Signed)
Patient reports to the ER for emesis and diarrhea. Patient reports he felt like Friday night it was hard to belch and that it has a strange odor when he is able to burp. Patient reports multiple runs of diarrhea. Denies sick exposure.
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
## 2020-08-09 NOTE — Discharge Instructions (Addendum)
At this time there does not appear to be the presence of an emergent medical condition, however there is always the potential for conditions to change. Please read and follow the below instructions.  Please return to the Emergency Department immediately for any new or worsening symptoms. Please be sure to follow up with your Primary Care Provider within one week regarding your visit today; please call their office to schedule an appointment even if you are feeling better for a follow-up visit. Your COVID test is currently pending, it should result in the next 24 hours.  Please check your MyChart account for those results and discuss them with your primary care provider at your follow-up visit.  Please wear a mask in public and continue to isolate to avoid spread of any illness to others. Please drink plenty water to avoid dehydration, get plenty of rest.  Please see your primary care provider for follow-up regarding your diarrhea next week You may take the medication Protonix and Carafate to help with heartburn related symptoms. Please continue monitor your blood sugar levels closely and follow-up with your PCP.  Go to the nearest Emergency Department immediately if: You have fever or chills You have a cough that gets worse, or you cough up blood. You have very bad (severe) pain in your belly (abdomen). You pass out (faint). You have either of these for no clear reason: Sudden chest discomfort. Sudden discomfort in your arms, back, neck, or jaw. You have shortness of breath at any time. You suddenly start to sweat, or your skin gets clammy. You feel sick to your stomach (nauseous). You throw up (vomit). You suddenly feel lightheaded or dizzy. You feel very weak or tired. Your heart starts to beat fast, or it feels like it is skipping beats. You feel very weak or you pass out (faint). You have bloody or black poop or poop that looks like tar. You have very bad pain, cramping, or bloating in  your belly (abdomen). You have trouble breathing or you are breathing very quickly. Your heart is beating very quickly. Your skin feels cold and clammy. You feel confused. You have chest pain You have any new/concerning or worsening of symptoms  Please read the additional information packets attached to your discharge summary.  Do not take your medicine if  develop an itchy rash, swelling in your mouth or lips, or difficulty breathing; call 911 and seek immediate emergency medical attention if this occurs.  You may review your lab tests and imaging results in their entirety on your MyChart account.  Please discuss all results of fully with your primary care provider and other specialist at your follow-up visit.  Note: Portions of this text may have been transcribed using voice recognition software. Every effort was made to ensure accuracy; however, inadvertent computerized transcription errors may still be present.

## 2020-08-24 ENCOUNTER — Telehealth (INDEPENDENT_AMBULATORY_CARE_PROVIDER_SITE_OTHER): Payer: Self-pay | Admitting: Primary Care

## 2020-08-24 DIAGNOSIS — Z09 Encounter for follow-up examination after completed treatment for conditions other than malignant neoplasm: Secondary | ICD-10-CM

## 2020-08-24 DIAGNOSIS — R109 Unspecified abdominal pain: Secondary | ICD-10-CM

## 2020-08-24 DIAGNOSIS — Z7689 Persons encountering health services in other specified circumstances: Secondary | ICD-10-CM

## 2020-08-24 NOTE — Progress Notes (Signed)
Telephone Note  I connected with Tyrone Steele on 08/24/20 at  3:50 PM EST by telephone and verified that I am speaking with the correct person using two identifiers.  Location: Patient: home  Provider: Milford Cage [email or web address]    I discussed the limitations, risks, security and privacy concerns of performing an evaluation and management service by telephone and the availability of in person appointments. I also discussed with the patient that there may be a patient responsible charge related to this service. The patient expressed understanding and agreed to proceed.   History of Present Illness:  Mr. Tyrone Steele is a 35 year old male present to the emergency room on  08/09/20, for Diarrhea and unspecified type Atypical chest pain  discharged on the same day. Today he is complaining of stomach pain bloating asked is anything contributing to abdominal pain.   Past Medical History:  Diagnosis Date  . Anemia   . Diabetes mellitus without complication [facility])    Current Outpatient Medications on File Prior to Visit  Medication Sig Dispense Refill  . blood glucose meter kit and supplies KIT Dispense based on patient and insurance preference. Use up to four times daily as directed. (FOR ICD-9 250.00, 250.01). 1 each 0  . gabapentin (NEURONTIN) 300 MG capsule Take 1 capsule (300 mg total) by mouth at bedtime. 30 capsule 3  . glipiZIDE (GLUCOTROL) 5 MG tablet Take 1 tablet (5 mg total) by mouth 2 (two) times daily before a meal. 60 tablet 3  . glucose blood test strip Use as instructed 100 each 2  . ibuprofen (ADVIL) 800 MG tablet Take 1 tablet (800 mg total) by mouth 3 (three) times daily. 21 tablet 0  . metFORMIN (GLUCOPHAGE) 500 MG tablet Take 2 tablets (1,000 mg total) by mouth 2 (two) times daily with a meal. 120 tablet 3  . Omega-3 Fatty Acids (FISH OIL) 1000 MG CAPS Take 3 capsules (3,000 mg total) by mouth daily. 90 capsule 5  . pantoprazole (PROTONIX) 20 MG tablet Take 1 tablet (20 mg  total) by mouth daily. 30 tablet 0  . predniSONE (STERAPRED UNI-PAK 21 TAB) 10 MG (21) TBPK tablet 6 tabs for 1 day, then 5 tabs for 1 das, then 4 tabs for 1 day, then 3 tabs for 1 day, 2 tabs for 1 day, then 1 tab for 1 day (Patient not taking: Reported on 09/25/2019) 21 tablet 0  . sucralfate (CARAFATE) 1 GM/10ML suspension Take 10 mLs (1 g total) by mouth 4 (four) times daily -  with meals and at bedtime. 420 mL 0   No current facility-administered medications on file prior to visit.   Observations/Objective: Review of Systems  Gastrointestinal: Positive for abdominal pain and heartburn.  All other systems reviewed and are negative.   Assessment and Plan: Diagnoses and all orders for this visit:  Hospital discharge follow-up Presented to the emergency room for diarrhea and emesis which she felt was caused by taking Gas-X.  His diarrhea and nausea have self resolved.  Abdominal pain, unspecified abdominal location Unknown etiology advised to decrease acidity in diet, fatty foods, caffeine and keeping a log of what causes his abdominal pain.  Rule out irritable bowel syndrome versus GERD.  If continues will need to reevaluate.  May try over-the-counter omeprazole 20 mg after dinner to determine if acid reflux.  Encounter to establish care Establish care with new provider  Follow Up Instructions:    I discussed the assessment and treatment plan with the patient.  The patient was provided an opportunity to ask questions and all were answered. The patient agreed with the plan and demonstrated an understanding of the instructions.   The patient was advised to call back or seek an in-person evaluation if the symptoms worsen or if the condition fails to improve as anticipated.  I provided 20 minutes of non-face-to-face time during this encounter.   Kerin Perna, NP

## 2020-10-01 ENCOUNTER — Ambulatory Visit: Payer: Self-pay | Admitting: Family Medicine

## 2020-10-13 MED FILL — METFORMIN HCL 500 MG TABS: 500 | 30 days supply | Qty: 120 | Fill #0

## 2020-11-24 ENCOUNTER — Other Ambulatory Visit: Payer: Self-pay

## 2020-11-24 ENCOUNTER — Ambulatory Visit: Payer: Self-pay | Attending: Family Medicine | Admitting: Critical Care Medicine

## 2020-11-24 ENCOUNTER — Encounter: Payer: Self-pay | Admitting: Critical Care Medicine
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
## 2020-11-24 NOTE — Progress Notes (Signed)
Patient ID: Tyrone Steele, male   DOB: [birth year], 35 y.o.   MRN: [id number] Could not connect with this patient later determined this patient had an incorrect phone number in the system  He messaged back via MyChart and wants an in person visit this will be rescheduled

## 2020-11-24 NOTE — Telephone Encounter (Signed)
Tyrone Steele   See the pts msg  Explains why I could not reach him  pls call him on his new number and get him rescheduled face to face

## 2020-12-02 ENCOUNTER — Other Ambulatory Visit: Payer: Self-pay

## 2020-12-02 ENCOUNTER — Encounter: Payer: Self-pay | Admitting: Critical Care Medicine

## 2020-12-02 ENCOUNTER — Ambulatory Visit: Payer: Self-pay | Attending: Critical Care Medicine | Admitting: Critical Care Medicine

## 2020-12-02 VITALS — BP 124/80 | HR 64 | Ht 69.0 in | Wt 210.8 lb

## 2020-12-02 DIAGNOSIS — K219 Gastro-esophageal reflux disease without esophagitis: Secondary | ICD-10-CM

## 2020-12-02 DIAGNOSIS — E1169 Type 2 diabetes mellitus with other specified complication: Secondary | ICD-10-CM

## 2020-12-02 DIAGNOSIS — E785 Hyperlipidemia, unspecified: Secondary | ICD-10-CM

## 2020-12-02 DIAGNOSIS — E1165 Type 2 diabetes mellitus with hyperglycemia: Secondary | ICD-10-CM

## 2020-12-02 DIAGNOSIS — Z114 Encounter for screening for human immunodeficiency virus [HIV]: Secondary | ICD-10-CM

## 2020-12-02 DIAGNOSIS — Z1159 Encounter for screening for other viral diseases: Secondary | ICD-10-CM

## 2020-12-02 HISTORY — DX: Gastro-esophageal reflux disease without esophagitis: K21.9

## 2020-12-02 LAB — POCT GLYCOSYLATED HEMOGLOBIN (HGB A1C): HbA1c, POC (controlled diabetic range): 12.1 % — AB (ref 0.0–7.0)

## 2020-12-02 LAB — GLUCOSE, POCT (MANUAL RESULT ENTRY): POC Glucose: 247 mg/dl — AB (ref 70–99)

## 2020-12-02 MED ORDER — TRUE METRIX BLOOD GLUCOSE TEST VI STRP
ORAL_STRIP | 12 refills | Status: DC
  Filled 2020-12-02: qty 100, 25d supply, fill #0

## 2020-12-02 MED ORDER — TRULICITY 0.75 MG/0.5ML ~~LOC~~ SOAJ
0.7500 mg | SUBCUTANEOUS | 4 refills | Status: DC
  Filled 2020-12-02: qty 2, 28d supply, fill #0

## 2020-12-02 MED ORDER — PANTOPRAZOLE SODIUM 40 MG PO TBEC
40.0000 mg | DELAYED_RELEASE_TABLET | Freq: Every day | ORAL | 3 refills | Status: DC
Start: 2020-12-02 — End: 2021-03-09
  Filled 2020-12-02: qty 30, 30d supply, fill #0

## 2020-12-02 MED ORDER — METFORMIN HCL 1000 MG PO TABS
1000.0000 mg | ORAL_TABLET | Freq: Two times a day (BID) | ORAL | 3 refills | Status: DC
Start: 2020-12-02 — End: 2021-01-13
  Filled 2020-12-02: qty 60, 30d supply, fill #0

## 2020-12-02 MED ORDER — TRUEPLUS LANCETS 28G MISC
1 refills | Status: DC
  Filled 2020-12-02: qty 100, 50d supply, fill #0

## 2020-12-02 MED ORDER — TRUE METRIX METER W/DEVICE KIT
PACK | 0 refills | Status: DC
  Filled 2020-12-02: qty 1, 1d supply, fill #0

## 2020-12-02 MED ORDER — ATORVASTATIN CALCIUM 10 MG PO TABS
10.0000 mg | ORAL_TABLET | Freq: Every day | ORAL | 3 refills | Status: DC
Start: 2020-12-02 — End: 2021-03-09
  Filled 2020-12-02: qty 30, 30d supply, fill #0

## 2020-12-02 NOTE — Assessment & Plan Note (Signed)
Begin atorvastatin daily Check lipid panel

## 2020-12-02 NOTE — Assessment & Plan Note (Signed)
Reflux without esophagitis  Begin Protonix 40 mg daily  Reflux and diabetic diet issued

## 2020-12-02 NOTE — Patient Instructions (Signed)
Resume metformin 1000 mg tablet twice daily with food Discontinue glipizide Begin Trulicity 1 injection weekly we will instruct you as to how to use this she will receive patient assistance  Begin pantoprazole 1 tablet 30 minutes before you eat daily  Start atorvastatin daily for cholesterol  Full set of lab screenings obtained today  Return to see Dr. Delford Field in 6 weeks see Franky Macho our clinical pharmacist in 2 weeks  Follow healthy diet as outlined below  Dulaglutide Injection What is this medicine? DULAGLUTIDE (DOO la GLOO tide) controls blood sugar in people with type 2 diabetes. It is used with lifestyle changes like diet and exercise. It may lower the risk of problems that need treatment in the hospital. These problems include heart attack or stroke. This medicine may be used for other purposes; ask your health care provider or pharmacist if you have questions. COMMON BRAND NAME(S): Trulicity What should I tell my health care provider before I take this medicine? They need to know if you have any of these conditions:  endocrine tumors (MEN 2) or if someone in your family had these tumors  eye disease, vision problems  history of pancreatitis  kidney disease  liver disease  stomach or intestine problems  thyroid cancer or if someone in your family had thyroid cancer  an unusual or allergic reaction to dulaglutide, other medicines, foods, dyes, or preservatives  pregnant or trying to get pregnant  breast-feeding How should I use this medicine? This medicine is injected under the skin. You will be taught how to prepare and give it. Take it as directed on the prescription label on the same day of each week. Do NOT prime the pen. Keep taking it unless your health care provider tells you to stop. If you use this medicine with insulin, you should inject this medicine and the insulin separately. Do not mix them together. Do not give the injections right next to each other. Change  (rotate) injection sites with each injection. This drug comes with INSTRUCTIONS FOR USE. Ask your pharmacist for directions on how to use this medicine. Read the information carefully. Talk to your pharmacist or health care provider if you have questions. It is important that you put your used needles and syringes in a special sharps container. Do not put them in a trash can. If you do not have a sharps container, call your pharmacist or health care provider to get one. A special MedGuide will be given to you by the pharmacist with each prescription and refill. Be sure to read this information carefully each time. Talk to your health care provider about the use of this medicine in children. Special care may be needed. Overdosage: If you think you have taken too much of this medicine contact a poison control center or emergency room at once. NOTE: This medicine is only for you. Do not share this medicine with others. What if I miss a dose? If you miss a dose, take it as soon as you can unless it is more than 3 days late. If it is more than 3 days late, skip the missed dose. Take the next dose at the normal time. What may interact with this medicine?  other medicines for diabetes Many medications may cause changes in blood sugar, these include:  alcohol containing beverages  antiviral medicines for HIV or AIDS  aspirin and aspirin-like drugs  certain medicines for blood pressure, heart disease, irregular heart beat  chromium  diuretics  male hormones, such as  estrogens or progestins, birth control pills  fenofibrate  gemfibrozil  isoniazid  lanreotide  male hormones or anabolic steroids  MAOIs like Carbex, Eldepryl, Marplan, Nardil, and Parnate  medicines for allergies, asthma, cold, or cough  medicines for depression, anxiety, or psychotic disturbances  medicines for weight loss  niacin  nicotine  NSAIDs, medicines for pain and inflammation, like ibuprofen or  naproxen  octreotide  pasireotide  pentamidine  phenytoin  probenecid  quinolone antibiotics such as ciprofloxacin, levofloxacin, ofloxacin  some herbal dietary supplements  steroid medicines such as prednisone or cortisone  sulfamethoxazole; trimethoprim  thyroid hormones Some medications can hide the warning symptoms of low blood sugar (hypoglycemia). You may need to monitor your blood sugar more closely if you are taking one of these medications. These include:  beta-blockers, often used for high blood pressure or heart problems (examples include atenolol, metoprolol, propranolol)  clonidine  guanethidine  reserpine This list may not describe all possible interactions. Give your health care provider a list of all the medicines, herbs, non-prescription drugs, or dietary supplements you use. Also tell them if you smoke, drink alcohol, or use illegal drugs. Some items may interact with your medicine. What should I watch for while using this medicine? Visit your health care provider for regular checks on your progress. Check with your health care provider if you have severe diarrhea, nausea, and vomiting, or if you sweat a lot. The loss of too much body fluid may make it dangerous for you to take this medicine. A test called the HbA1C (A1C) will be monitored. This is a simple blood test. It measures your blood sugar control over the last 2 to 3 months. You will receive this test every 3 to 6 months. Learn how to check your blood sugar. Learn the symptoms of low and high blood sugar and how to manage them. Always carry a quick-source of sugar with you in case you have symptoms of low blood sugar. Examples include hard sugar candy or glucose tablets. Make sure others know that you can choke if you eat or drink when you develop serious symptoms of low blood sugar, such as seizures or unconsciousness. Get medical help at once. Tell your health care provider if you have high blood  sugar. You might need to change the dose of your medicine. If you are sick or exercising more than usual, you may need to change the dose of your medicine. Do not skip meals. Ask your health care provider if you should avoid alcohol. Many nonprescription cough and cold products contain sugar or alcohol. These can affect blood sugar. Pens should never be shared. Even if the needle is changed, sharing may result in passing of viruses like hepatitis or HIV. Wear a medical ID bracelet or chain. Carry a card that describes your condition. List the medicines and doses you take on the card. What side effects may I notice from receiving this medicine? Side effects that you should report to your doctor or health care professional as soon as possible:  allergic reactions (skin rash, itching or hives; swelling of the face, lips, or tongue)  changes in vision  diarrhea that continues or is severe  infection (fever, chills, cough, sore throat, pain or trouble passing urine)  kidney injury (trouble passing urine or change in the amount of urine)  low blood sugar (feeling anxious; confusion; dizziness; increased hunger; unusually weak or tired; increased sweating; shakiness; cold, clammy skin; irritable; headache; blurred vision; fast heartbeat; loss of consciousness)  lump or swelling on the neck  trouble breathing  trouble swallowing  unusual stomach upset or pain  vomiting Side effects that usually do not require medical attention (report to your doctor or health care professional if they continue or are bothersome):  lack or loss of appetite  nausea  pain, redness, or irritation at site where injected This list may not describe all possible side effects. Call your doctor for medical advice about side effects. You may report side effects to FDA at 1-800-FDA-1088. Where should I keep my medicine? Keep out of the reach of children and pets. Refrigeration (preferred): Store unopened pens in a  refrigerator between 2 and 8 degrees C (36 and 46 degrees F). Keep it in the original carton until you are ready to take it. Do not freeze or use if the medicine has been frozen. Protect from light. Get rid of any unused medicine after the expiration date on the label. Room Temperature: The pen may be stored at room temperature below 30 degrees C (86 degrees F) for up to a total of 14 days if needed. Protect from light. Avoid exposure to extreme heat. If it is stored at room temperature, throw away any unused medicine after 14 days or after it expires, whichever is first. To get rid of medicines that are no longer needed or have expired:  Take the medicine to a medicine take-back program. Check with your pharmacy or law enforcement to find a location.  If you cannot return the medicine, ask your pharmacist or health care provider how to get rid of this medicine safely. NOTE: This sheet is a summary. It may not cover all possible information. If you have questions about this medicine, talk to your doctor, pharmacist, or health care provider.  2021 Elsevier/Gold Standard (2020-05-04 07:35:51)  Food Choices for Gastroesophageal Reflux Disease, Adult When you have gastroesophageal reflux disease (GERD), the foods you eat and your eating habits are very important. Choosing the right foods can help ease your discomfort. Think about working with a food expert (dietitian) to help you make good choices. What are tips for following this plan? Reading food labels  Look for foods that are low in saturated fat. Foods that may help with your symptoms include: ? Foods that have less than 5% of daily value (DV) of fat. ? Foods that have 0 grams of trans fat. Cooking  Do not fry your food.  Cook your food by baking, steaming, grilling, or broiling. These are all methods that do not need a lot of fat for cooking.  To add flavor, try to use herbs that are low in spice and acidity. Meal planning  Choose  healthy foods that are low in fat, such as: ? Fruits and vegetables. ? Whole grains. ? Low-fat dairy products. ? Lean meats, fish, and poultry.  Eat small meals often instead of eating 3 large meals each day. Eat your meals slowly in a place where you are relaxed. Avoid bending over or lying down until 2-3 hours after eating.  Limit high-fat foods such as fatty meats or fried foods.  Limit your intake of fatty foods, such as oils, butter, and shortening.  Avoid the following as told by your doctor: ? Foods that cause symptoms. These may be different for different people. Keep a food diary to keep track of foods that cause symptoms. ? Alcohol. ? Drinking a lot of liquid with meals. ? Eating meals during the 2-3 hours before bed.   Lifestyle  Stay at a healthy weight. Ask your doctor what weight is healthy for you. If you need to lose weight, work with your doctor to do so safely.  Exercise for at least 30 minutes on 5 or more days each week, or as told by your doctor.  Wear loose-fitting clothes.  Do not smoke or use any products that contain nicotine or tobacco. If you need help quitting, ask your doctor.  Sleep with the head of your bed higher than your feet. Use a wedge under the mattress or blocks under the bed frame to raise the head of the bed.  Chew sugar-free gum after meals. What foods should eat? Eat a healthy, well-balanced diet of fruits, vegetables, whole grains, low-fat dairy products, lean meats, fish, and poultry. Each person is different. Foods that may cause symptoms in one person may not cause any symptoms in another person. Work with your doctor to find foods that are safe for you. The items listed above may not be a complete list of what you can eat and drink. Contact a food expert for more options.   What foods should I avoid? Limiting some of these foods may help in managing the symptoms of GERD. Everyone is different. Talk with a food expert or your doctor to  help you find the exact foods to avoid, if any. Fruits Any fruits prepared with added fat. Any fruits that cause symptoms. For some people, this may include citrus fruits, such as oranges, grapefruit, pineapple, and lemons. Vegetables Deep-fried vegetables. [place] fries. Any vegetables prepared with added fat. Any vegetables that cause symptoms. For some people, this may include tomatoes and tomato products, chili peppers, onions and garlic, and horseradish. Grains Pastries or quick breads with added fat. Meats and other proteins High-fat meats, such as fatty beef or pork, hot dogs, ribs, ham, sausage, salami, and bacon. Fried meat or protein, including fried fish and fried chicken. Nuts and nut butters, in large amounts. Dairy Whole milk and chocolate milk. Sour cream. Cream. Ice cream. Cream cheese. Milkshakes. Fats and oils Butter. Margarine. Shortening. Ghee. Beverages Coffee and tea, with or without caffeine. Carbonated beverages. Sodas. Energy drinks. Fruit juice made with acidic fruits, such as orange or grapefruit. Tomato juice. Alcoholic drinks. Sweets and desserts Chocolate and cocoa. Donuts. Seasonings and condiments Pepper. Peppermint and spearmint. Added salt. Any condiments, herbs, or seasonings that cause symptoms. For some people, this may include curry, hot sauce, or vinegar-based salad dressings. The items listed above may not be a complete list of what you should not eat and drink. Contact a food expert for more options. Questions to ask your doctor Diet and lifestyle changes are often the first steps that are taken to manage symptoms of GERD. If diet and lifestyle changes do not help, talk with your doctor about taking medicines. Where to find more information  International Foundation for Gastrointestinal Disorders: aboutgerd.org Summary  When you have GERD, food and lifestyle choices are very important in easing your symptoms.  Eat small meals often instead of 3  large meals a day. Eat your meals slowly and in a place where you are relaxed.  Avoid bending over or lying down until 2-3 hours after eating.  Limit high-fat foods such as fatty meats or fried foods. This information is not intended to replace advice given to you by your health care provider. Make sure you discuss any questions you have with your health care provider. Document Revised: 01/13/2020 Document Reviewed: 01/13/2020 Elsevier Patient Education  2021 Elsevier Inc.  Diabetes Mellitus and Foot Care Foot care is an important part of your health, especially when you have diabetes. Diabetes may cause you to have problems because of poor blood flow (circulation) to your feet and legs, which can cause your skin to:  Become thinner and drier.  Break more easily.  Heal more slowly.  Peel and crack. You may also have nerve damage (neuropathy) in your legs and feet, causing decreased feeling in them. This means that you may not notice minor injuries to your feet that could lead to more serious problems. Noticing and addressing any potential problems early is the best way to prevent future foot problems. How to care for your feet Foot hygiene  Wash your feet daily with warm water and mild soap. Do not use hot water. Then, pat your feet and the areas between your toes until they are completely dry. Do not soak your feet as this can dry your skin.  Trim your toenails straight across. Do not dig under them or around the cuticle. File the edges of your nails with an emery board or nail file.  Apply a moisturizing lotion or petroleum jelly to the skin on your feet and to dry, brittle toenails. Use lotion that does not contain alcohol and is unscented. Do not apply lotion between your toes.   Shoes and socks  Wear clean socks or stockings every day. Make sure they are not too tight. Do not wear knee-high stockings since they may decrease blood flow to your legs.  Wear shoes that fit properly  and have enough cushioning. Always look in your shoes before you put them on to be sure there are no objects inside.  To break in new shoes, wear them for just a few hours a day. This prevents injuries on your feet. Wounds, scrapes, corns, and calluses  Check your feet daily for blisters, cuts, bruises, sores, and redness. If you cannot see the bottom of your feet, use a mirror or ask someone for help.  Do not cut corns or calluses or try to remove them with medicine.  If you find a minor scrape, cut, or break in the skin on your feet, keep it and the skin around it clean and dry. You may clean these areas with mild soap and water. Do not clean the area with peroxide, alcohol, or iodine.  If you have a wound, scrape, corn, or callus on your foot, look at it several times a day to make sure it is healing and not infected. Check for: ? Redness, swelling, or pain. ? Fluid or blood. ? Warmth. ? Pus or a bad smell.   General tips  Do not cross your legs. This may decrease blood flow to your feet.  Do not use heating pads or hot water bottles on your feet. They may burn your skin. If you have lost feeling in your feet or legs, you may not know this is happening until it is too late.  Protect your feet from hot and cold by wearing shoes, such as at the beach or on hot pavement.  Schedule a complete foot exam at least once a year (annually) or more often if you have foot problems. Report any cuts, sores, or bruises to your health care provider immediately. Where to find more information  American Diabetes Association: www.diabetes.org  Association of Diabetes Care & Education Specialists: www.diabeteseducator.org Contact a health care provider if:  You have a medical condition that increases your  risk of infection and you have any cuts, sores, or bruises on your feet.  You have an injury that is not healing.  You have redness on your legs or feet.  You feel burning or tingling in your  legs or feet.  You have pain or cramps in your legs and feet.  Your legs or feet are numb.  Your feet always feel cold.  You have pain around any toenails. Get help right away if:  You have a wound, scrape, corn, or callus on your foot and: ? You have pain, swelling, or redness that gets worse. ? You have fluid or blood coming from the wound, scrape, corn, or callus. ? Your wound, scrape, corn, or callus feels warm to the touch. ? You have pus or a bad smell coming from the wound, scrape, corn, or callus. ? You have a fever. ? You have a red line going up your leg. Summary  Check your feet every day for blisters, cuts, bruises, sores, and redness.  Apply a moisturizing lotion or petroleum jelly to the skin on your feet and to dry, brittle toenails.  Wear shoes that fit properly and have enough cushioning.  If you have foot problems, report any cuts, sores, or bruises to your health care provider immediately.  Schedule a complete foot exam at least once a year (annually) or more often if you have foot problems. This information is not intended to replace advice given to you by your health care provider. Make sure you discuss any questions you have with your health care provider. Document Revised: 01/23/2020 Document Reviewed: 01/23/2020 Elsevier Patient Education  2021 Elsevier Inc.  Diabetes Mellitus and Nutrition, Adult When you have diabetes, or diabetes mellitus, it is very important to have healthy eating habits because your blood sugar (glucose) levels are greatly affected by what you eat and drink. Eating healthy foods in the right amounts, at about the same times every day, can help you:  Control your blood glucose.  Lower your risk of heart disease.  Improve your blood pressure.  Reach or maintain a healthy weight. What can affect my meal plan? Every person with diabetes is different, and each person has different needs for a meal plan. Your health care provider  may recommend that you work with a dietitian to make a meal plan that is best for you. Your meal plan may vary depending on factors such as:  The calories you need.  The medicines you take.  Your weight.  Your blood glucose, blood pressure, and cholesterol levels.  Your activity level.  Other health conditions you have, such as heart or kidney disease. How do carbohydrates affect me? Carbohydrates, also called carbs, affect your blood glucose level more than any other type of food. Eating carbs naturally raises the amount of glucose in your blood. Carb counting is a method for keeping track of how many carbs you eat. Counting carbs is important to keep your blood glucose at a healthy level, especially if you use insulin or take certain oral diabetes medicines. It is important to know how many carbs you can safely have in each meal. This is different for every person. Your dietitian can help you calculate how many carbs you should have at each meal and for each snack. How does alcohol affect me? Alcohol can cause a sudden decrease in blood glucose (hypoglycemia), especially if you use insulin or take certain oral diabetes medicines. Hypoglycemia can be a life-threatening condition. Symptoms of hypoglycemia, such  as sleepiness, dizziness, and confusion, are similar to symptoms of having too much alcohol.  Do not drink alcohol if: ? Your health care provider tells you not to drink. ? You are pregnant, may be pregnant, or are planning to become pregnant.  If you drink alcohol: ? Do not drink on an empty stomach. ? Limit how much you use to:  0-1 drink a day for women.  0-2 drinks a day for men. ? Be aware of how much alcohol is in your drink. In the [place] one drink equals one 12 oz bottle of beer (355 mL), one 5 oz glass of wine (148 mL), or one 1 oz glass of hard liquor (44 mL). ? Keep yourself hydrated with water, diet soda, or unsweetened iced tea.  Keep in mind that regular soda,  juice, and other mixers may contain a lot of sugar and must be counted as carbs. What are tips for following this plan? Reading food labels  Start by checking the serving size on the "Nutrition Facts" label of packaged foods and drinks. The amount of calories, carbs, fats, and other nutrients listed on the label is based on one serving of the item. Many items contain more than one serving per package.  Check the total grams (g) of carbs in one serving. You can calculate the number of servings of carbs in one serving by dividing the total carbs by 15. For example, if a food has 30 g of total carbs per serving, it would be equal to 2 servings of carbs.  Check the number of grams (g) of saturated fats and trans fats in one serving. Choose foods that have a low amount or none of these fats.  Check the number of milligrams (mg) of salt (sodium) in one serving. Most people should limit total sodium intake to less than 2,300 mg per day.  Always check the nutrition information of foods labeled as "low-fat" or "nonfat." These foods may be higher in added sugar or refined carbs and should be avoided.  Talk to your dietitian to identify your daily goals for nutrients listed on the label. Shopping  Avoid buying canned, pre-made, or processed foods. These foods tend to be high in fat, sodium, and added sugar.  Shop around the outside edge of the grocery store. This is where you will most often find fresh fruits and vegetables, bulk grains, fresh meats, and fresh dairy. Cooking  Use low-heat cooking methods, such as baking, instead of high-heat cooking methods like deep frying.  Cook using healthy oils, such as olive, canola, or sunflower oil.  Avoid cooking with butter, cream, or high-fat meats. Meal planning  Eat meals and snacks regularly, preferably at the same times every day. Avoid going long periods of time without eating.  Eat foods that are high in fiber, such as fresh fruits, vegetables,  beans, and whole grains. Talk with your dietitian about how many servings of carbs you can eat at each meal.  Eat 4-6 oz (112-168 g) of lean protein each day, such as lean meat, chicken, fish, eggs, or tofu. One ounce (oz) of lean protein is equal to: ? 1 oz (28 g) of meat, chicken, or fish. ? 1 egg. ?  cup (62 g) of tofu.  Eat some foods each day that contain healthy fats, such as avocado, nuts, seeds, and fish.   What foods should I eat? Fruits Berries. Apples. Oranges. Peaches. Apricots. Plums. Grapes. Mango. Papaya. Pomegranate. Kiwi. Cherries. Vegetables Lettuce. Spinach. Leafy greens,  including kale, chard, collard greens, and mustard greens. Beets. Cauliflower. Cabbage. Broccoli. Carrots. Green beans. Tomatoes. Peppers. Onions. Cucumbers. Brussels sprouts. Grains Whole grains, such as whole-wheat or whole-grain bread, crackers, tortillas, cereal, and pasta. Unsweetened oatmeal. Quinoa. Brown or wild rice. Meats and other proteins Seafood. Poultry without skin. Lean cuts of poultry and beef. Tofu. Nuts. Seeds. Dairy Low-fat or fat-free dairy products such as milk, yogurt, and cheese. The items listed above may not be a complete list of foods and beverages you can eat. Contact a dietitian for more information. What foods should I avoid? Fruits Fruits canned with syrup. Vegetables Canned vegetables. Frozen vegetables with butter or cream sauce. Grains Refined white flour and flour products such as bread, pasta, snack foods, and cereals. Avoid all processed foods. Meats and other proteins Fatty cuts of meat. Poultry with skin. Breaded or fried meats. Processed meat. Avoid saturated fats. Dairy Full-fat yogurt, cheese, or milk. Beverages Sweetened drinks, such as soda or iced tea. The items listed above may not be a complete list of foods and beverages you should avoid. Contact a dietitian for more information. Questions to ask a health care provider  Do I need to meet with a  diabetes educator?  Do I need to meet with a dietitian?  What number can I call if I have questions?  When are the best times to check my blood glucose? Where to find more information:  American Diabetes Association: diabetes.org  Academy of Nutrition and Dietetics: www.eatright.[employer] of Diabetes and Digestive and Kidney Diseases: CarFlippers.tn  Association of Diabetes Care and Education Specialists: www.diabeteseducator.org Summary  It is important to have healthy eating habits because your blood sugar (glucose) levels are greatly affected by what you eat and drink.  A healthy meal plan will help you control your blood glucose and maintain a healthy lifestyle.  Your health care provider may recommend that you work with a dietitian to make a meal plan that is best for you.  Keep in mind that carbohydrates (carbs) and alcohol have immediate effects on your blood glucose levels. It is important to count carbs and to use alcohol carefully. This information is not intended to replace advice given to you by your health care provider. Make sure you discuss any questions you have with your health care provider. Document Revised: 06/11/2019 Document Reviewed: 06/11/2019 Elsevier Patient Education  2021 [employer].

## 2020-12-02 NOTE — Assessment & Plan Note (Addendum)
>>  ASSESSMENT AND PLAN FOR TYPE 2 DIABETES MELLITUS (HCC) WRITTEN ON 12/02/2020 12:34 PM BY Jadalee Westcott E, MD  Type 2 diabetes A1c greater than 12 lack of access to medications and testing supplies  Plan to renew prescription for testing supplies Resume metformin at 1000 mg twice daily with food Begin Trulicity 0.75 mg weekly patient instructed as to proper use prescription sent we will get patient assistance  Follow-up with clinical pharmacist in 2 weeks Dr. Delford Field in 2 months    >>ASSESSMENT AND PLAN FOR HYPERLIPIDEMIA ASSOCIATED WITH TYPE 2 DIABETES MELLITUS (HCC) WRITTEN ON 12/02/2020 12:34 PM BY Able Malloy E, MD  Begin atorvastatin daily Check lipid panel

## 2020-12-02 NOTE — Progress Notes (Signed)
Has been having heartburn. Need medication refill Tingling in feet.

## 2020-12-02 NOTE — Progress Notes (Signed)
Subjective:    Patient ID: Tyrone Steele, male    DOB: [birth year], 35 y.o.   MRN: [id number]  34 y.o.M here to est pcp  12/02/2020 This patient is a 35 year old male seen for evaluation of type 2 diabetes reflux symptoms and to establish for primary care.  Patient's had diabetes diagnosed recently in 2020 and has been out of metformin now for about a month.  He was on 500 mg daily of the metformin and blood sugars at that time of been running the 200 range on arrival today blood sugar is 247 blood pressure 124/80  Patient has some numbness in the feet and does note chronic recurrent abdominal pain with reflux symptom complex otherwise no other complaints are generated   Patient does complain of some chest discomfort when refluxing and polyuria   Past Medical History:  Diagnosis Date  . Anemia   . Diabetes mellitus without complication ([place])      Family History  Problem Relation Age of Onset  . Diabetes Mother   . Hypertension Father   . Diabetes Father      Social History   Socioeconomic History  . Marital status: Single    Spouse name: Not on file  . Number of children: Not on file  . Years of education: Not on file  . Highest education level: Not on file  Occupational History  . Not on file  Tobacco Use  . Smoking status: Never Smoker  . Smokeless tobacco: Never Used  Vaping Use  . Vaping Use: Never used  Substance and Sexual Activity  . Alcohol use: No  . Drug use: No  . Sexual activity: Yes  Other Topics Concern  . Not on file  Social History Narrative  . Not on file   Social Determinants of Health   Financial Resource Strain: Not on file  Food Insecurity: Not on file  Transportation Needs: Not on file  Physical Activity: Not on file  Stress: Not on file  Social Connections: Not on file  Intimate Partner Violence: Not on file     No Known Allergies   Outpatient Medications Prior to Visit  Medication Sig Dispense Refill  . blood glucose meter  kit and supplies KIT Dispense based on patient and insurance preference. Use up to four times daily as directed. (FOR ICD-9 250.00, 250.01). 1 each 0  . gabapentin (NEURONTIN) 300 MG capsule Take 1 capsule (300 mg total) by mouth at bedtime. 30 capsule 3  . glipiZIDE (GLUCOTROL) 5 MG tablet Take 1 tablet (5 mg total) by mouth 2 (two) times daily before a meal. 60 tablet 3  . glucose blood test strip Use as instructed 100 each 2  . ibuprofen (ADVIL) 800 MG tablet Take 1 tablet (800 mg total) by mouth 3 (three) times daily. 21 tablet 0  . metFORMIN (GLUCOPHAGE) 500 MG tablet Take 2 tablets (1,000 mg total) by mouth 2 (two) times daily with a meal. 120 tablet 3  . metFORMIN (GLUCOPHAGE) 500 MG tablet TAKE 2 TABLETS (1,000 MG TOTAL) BY MOUTH 2 (TWO) TIMES DAILY WITH A MEAL. 120 tablet 3  . Omega-3 Fatty Acids (FISH OIL) 1000 MG CAPS Take 3 capsules (3,000 mg total) by mouth daily. 90 capsule 5  . pantoprazole (PROTONIX) 20 MG tablet Take 1 tablet (20 mg total) by mouth daily. 30 tablet 0  . predniSONE (STERAPRED UNI-PAK 21 TAB) 10 MG (21) TBPK tablet 6 tabs for 1 day, then 5 tabs for 1 das, then  4 tabs for 1 day, then 3 tabs for 1 day, 2 tabs for 1 day, then 1 tab for 1 day 21 tablet 0  . sucralfate (CARAFATE) 1 GM/10ML suspension Take 10 mLs (1 g total) by mouth 4 (four) times daily -  with meals and at bedtime. 420 mL 0   No facility-administered medications prior to visit.    Tablets been acting metformin/get your appointment table follow-up with diabetes Proliferative feet make sure there is no ulcers  Review of Systems  Constitutional: Positive for fatigue.  HENT: Negative.   Eyes: Negative for visual disturbance.  Respiratory: Positive for chest tightness.   Cardiovascular: Positive for chest pain.  Gastrointestinal: Positive for abdominal pain.       Gerd  Endocrine: Positive for polyuria.  Genitourinary: Negative.   Musculoskeletal: Negative.   Skin: Negative.   Neurological:  Negative.   Psychiatric/Behavioral: Negative for decreased concentration, dysphoric mood, hallucinations, self-injury, sleep disturbance and suicidal ideas. The patient is not nervous/anxious and is not hyperactive.        Objective:   Physical Exam Vitals:   12/02/20 0830  BP: 124/80  Pulse: 64  SpO2: 99%  Weight: 210 lb 12.8 oz (95.6 kg)  Height: _0  (1.753 m)    Gen: Pleasant, well-nourished, in no distress,  normal affect  ENT: No lesions,  mouth clear,  oropharynx clear, no postnasal drip, moderate periodontal disease  Neck: No JVD, no TMG, no carotid bruits  Lungs: No use of accessory muscles, no dullness to percussion, clear without rales or rhonchi  Cardiovascular: RRR, heart sounds normal, no murmur or gallops, no peripheral edema  Abdomen: soft and NT, no HSM,  BS normal  Musculoskeletal: No deformities, no cyanosis or clubbing  Neuro: alert, non focal  Skin: Warm, no lesions or rashes  Foot exam basically normal except for calluses on the medial aspect of both great toes   HbA1c, POC (controlled diabetic range)  Date Value Ref Range Status  12/02/2020 12.1 (A) 0.0 - 7.0 % Final  09/25/2019 6.6 0.0 - 7.0 % Final   HbA1c POC (<> result, manual entry)  Date Value Ref Range Status  01/03/2019 >15 4.0 - 5.6 % Final       Assessment & Plan:  I personally reviewed all images and lab data in the [facility] system as well as any outside material available during this office visit and agree with the  radiology impressions.   Type 2 diabetes mellitus (HCC) Type 2 diabetes A1c greater than 12 lack of access to medications and testing supplies  Plan to renew prescription for testing supplies Resume metformin at 1000 mg twice daily with food Begin Trulicity 3.12 mg weekly patient instructed as to proper use prescription sent we will get patient assistance  Follow-up with clinical pharmacist in 2 weeks Dr. Joya Gaskins in 2 months    Hyperlipidemia associated with type  2 diabetes mellitus ([place]) Begin atorvastatin daily Check lipid panel  GERD without esophagitis Reflux without esophagitis  Begin Protonix 40 mg daily  Reflux and diabetic diet issued   [place]an was seen today for diabetes.  Diagnoses and all orders for this visit:  Type 2 diabetes mellitus with hyperglycemia, without long-term current use of insulin (HCC) -     POCT glucose (manual entry) -     POCT glycosylated hemoglobin (Hb A1C) -     CBC with Differential/Platelet -     Comprehensive metabolic panel -     Lipid panel -  Microalbumin / creatinine urine ratio  Encounter for screening for HIV -     HIV Antibody (routine testing w rflx)  Need for hepatitis C screening test -     HCV Ab w Reflex to Quant PCR  Hyperlipidemia associated with type 2 diabetes mellitus ([place])  GERD without esophagitis  Other orders -     Blood Glucose Monitoring Suppl (TRUE METRIX METER) w/Device KIT; Use to measure blood sugar twice a day -     TRUEplus Lancets 28G MISC; Use to measure blood sugar twice a day -     glucose blood (TRUE METRIX BLOOD GLUCOSE TEST) test strip; Use as instructed -     metFORMIN (GLUCOPHAGE) 1000 MG tablet; Take 1 tablet (1,000 mg total) by mouth 2 (two) times daily with a meal. -     Dulaglutide (TRULICITY) 8.76 OT/1.5BW SOPN; Inject 0.75 mg into the skin once a week. -     pantoprazole (PROTONIX) 40 MG tablet; Take 1 tablet (40 mg total) by mouth daily. -     atorvastatin (LIPITOR) 10 MG tablet; Take 1 tablet (10 mg total) by mouth daily.   Perform HIV and hepatitis C screenings get urine for microalbumin 46 minutes spent reviewing old records formulating complex medical decision making plan giving diabetic education to the patient performing this note

## 2020-12-02 NOTE — Progress Notes (Deleted)
   Subjective:    Patient ID: Tyrone Steele, male    DOB: 03/20/1986, 34 y.o.   MRN: [id number]  HPI    Review of Systems     Objective:   Physical Exam        Assessment & Plan:

## 2020-12-03 LAB — CBC WITH DIFFERENTIAL/PLATELET
Basophils Absolute: 0 10*3/uL (ref 0.0–0.2)
Basos: 0 %
EOS (ABSOLUTE): 0.2 10*3/uL (ref 0.0–0.4)
Eos: 4 %
Hematocrit: 46 % (ref 37.5–51.0)
Hemoglobin: 15 g/dL (ref 13.0–17.7)
Immature Grans (Abs): 0 10*3/uL (ref 0.0–0.1)
Immature Granulocytes: 1 %
Lymphocytes Absolute: 1.8 10*3/uL (ref 0.7–3.1)
Lymphs: 42 %
MCH: 26.8 pg (ref 26.6–33.0)
MCHC: 32.6 g/dL (ref 31.5–35.7)
MCV: 82 fL (ref 79–97)
Monocytes Absolute: 0.4 10*3/uL (ref 0.1–0.9)
Monocytes: 8 %
Neutrophils Absolute: 2 10*3/uL (ref 1.4–7.0)
Neutrophils: 45 %
Platelets: 207 10*3/uL (ref 150–450)
RBC: 5.6 x10E6/uL (ref 4.14–5.80)
RDW: 13.7 % (ref 11.6–15.4)
WBC: 4.3 10*3/uL (ref 3.4–10.8)

## 2020-12-03 LAB — COMPREHENSIVE METABOLIC PANEL
ALT: 50 IU/L — ABNORMAL HIGH (ref 0–44)
AST: 34 IU/L (ref 0–40)
Albumin/Globulin Ratio: 1.8 (ref 1.2–2.2)
Albumin: 4.6 g/dL (ref 4.0–5.0)
Alkaline Phosphatase: 69 IU/L (ref 44–121)
BUN/Creatinine Ratio: 11 (ref 9–20)
BUN: 11 mg/dL (ref 6–20)
Bilirubin Total: 0.4 mg/dL (ref 0.0–1.2)
CO2: 23 mmol/L (ref 20–29)
Calcium: 9.2 mg/dL (ref 8.7–10.2)
Chloride: 103 mmol/L (ref 96–106)
Creatinine, Ser: 0.98 mg/dL (ref 0.76–1.27)
Globulin, Total: 2.5 g/dL (ref 1.5–4.5)
Glucose: 237 mg/dL — ABNORMAL HIGH (ref 65–99)
Potassium: 3.9 mmol/L (ref 3.5–5.2)
Sodium: 140 mmol/L (ref 134–144)
Total Protein: 7.1 g/dL (ref 6.0–8.5)
eGFR: 103 mL/min/{1.73_m2} (ref >59)

## 2020-12-03 LAB — HIV ANTIBODY (ROUTINE TESTING W REFLEX): HIV Screen 4th Generation wRfx: NONREACTIVE

## 2020-12-03 LAB — HCV AB W REFLEX TO QUANT PCR: HCV Ab: <0.1 s/co ratio (ref 0.0–0.9)

## 2020-12-03 LAB — LIPID PANEL
Chol/HDL Ratio: 5.8 ratio — ABNORMAL HIGH (ref 0.0–5.0)
Cholesterol, Total: 162 mg/dL (ref 100–199)
HDL: 28 mg/dL — ABNORMAL LOW (ref >39)
LDL Chol Calc (NIH): 108 mg/dL — ABNORMAL HIGH (ref 0–99)
Triglycerides: 145 mg/dL (ref 0–149)
VLDL Cholesterol Cal: 26 mg/dL (ref 5–40)

## 2020-12-03 LAB — MICROALBUMIN / CREATININE URINE RATIO
Creatinine, Urine: 169.5 mg/dL
Microalb/Creat Ratio: 19 mg/g creat (ref 0–29)
Microalbumin, Urine: 32.3 ug/mL

## 2020-12-03 LAB — HCV INTERPRETATION

## 2020-12-04 ENCOUNTER — Telehealth: Payer: Self-pay

## 2020-12-04 NOTE — Telephone Encounter (Signed)
Patient name and DOB has been verified Patient was informed of lab results. Patient had no questions.  

## 2020-12-04 NOTE — Telephone Encounter (Signed)
-----   Message from Storm Frisk, MD sent at 12/04/2020  7:31 AM EDT ----- Let pt know hiv and hep C neg.  Blood counts normal

## 2020-12-16 ENCOUNTER — Other Ambulatory Visit: Payer: Self-pay

## 2020-12-16 ENCOUNTER — Encounter: Payer: Self-pay | Admitting: Pharmacist

## 2020-12-16 ENCOUNTER — Ambulatory Visit: Payer: Self-pay | Attending: Family Medicine | Admitting: Pharmacist

## 2020-12-16 DIAGNOSIS — E1165 Type 2 diabetes mellitus with hyperglycemia: Secondary | ICD-10-CM

## 2020-12-16 LAB — GLUCOSE, POCT (MANUAL RESULT ENTRY): POC Glucose: 142 mg/dl — AB (ref 70–99)

## 2020-12-16 MED ORDER — TRULICITY 1.5 MG/0.5ML ~~LOC~~ SOAJ
1.5000 mg | SUBCUTANEOUS | 4 refills | Status: DC
  Filled 2020-12-16: qty 2, 28d supply, fill #0
  Filled 2021-02-02: qty 2, 28d supply, fill #1
  Filled 2021-03-02: qty 2, 28d supply, fill #2

## 2020-12-16 NOTE — Progress Notes (Signed)
S:     PCP: Dr. Delford Field PMH: T2DM, HLD  Patient arrives in good spirits. Presents for diabetes evaluation, education, and management Patient was referred and last seen by Primary Care Provider on 12/02/20. At that visit, A1c increased from 6.6% to 12.1% due to limited access to medications (reported out of metformin for ~1 month) and not having testing supplies. Pt started on Trulicity 0.75 mg weekly. Additionally, pt was started on atorvastatin 10 mg daily.   Patient reports Diabetes was diagnosed in 2020.   Today, patient reports medication adherence with Trulicity 0.75 mg weekly and metformin 1000 mg BID. Reports 2 cm injection site bruise located on right abdomen area that appears to be healing well. Bruise occurred yesterday after 2nd Trulicity injection, however noticed bruise this morning after taking a shower. Denies bleeding, swelling, pain, knot/cyst, and pus. Denies injection site reaction with 1st Trulicity injection that was administered on left lower abdomen. Denies GI issues. Reports BG prior to visit today was 161 and has been noticing improvements in BG since starting Trulicity (BG 151-211). Denies BG<70. Reports Trulicity has been curbing his diet and is drinking more water and eating smaller portions.   Family/Social History:  -Fhx: DM, HTN -Tobacco use: denies  Insurance coverage/medication affordability: dnbi  Medication adherence reported good.   Current diabetes medications include: Trulicity 0.75 mg weekly, metformin 1000 mg BID Current hypertension medications include: none Current hyperlipidemia medications include: atorvastatin 10 mg daily, omega-3 fatty acids 3,000 mg daily  Patient denies hypoglycemic events.  Patient reported dietary habits: drinking more water and eating smaller portions  Patient-reported exercise habits: none  O:  POCT: 142 (fasting)  Home blood sugars: 151-211  Lab Results  Component Value Date   HGBA1C 12.1 (A) 12/02/2020    There were no vitals filed for this visit.  Lipid Panel     Component Value Date/Time   CHOL 162 12/02/2020 0916   TRIG 145 12/02/2020 0916   HDL 28 (L) 12/02/2020 0916   CHOLHDL 5.8 (H) 12/02/2020 0916   LDLCALC 108 (H) 12/02/2020 0916    Clinical Atherosclerotic Cardiovascular Disease (ASCVD): No  The ASCVD Risk score Denman George DC Jr., et al., 2013) failed to calculate for the following reasons:   The 2013 ASCVD risk score is only valid for ages 55 to 57   A/P: Diabetes longstanding currently uncontrolled. Home CBGs improving since the addition of Trulicity. Medication adherence appears optimal.  Injection site bruise appears to be healing well - no active bleeding, swelling, knot, or pus. Advised patient to continue to monitor bruise and to contact our office if it worsens at anytime. Patient verbalized understanding. Encouraged patient to aim for a diet full of vegetables, fruit and lean meats (chicken, [place], fish) and to limit carbs (bread, pasta, sugar, rice) and red meat consumption. Encouraged patient to exercise 20-30 minutes daily with the goal of 150 minutes per week. Patient verbalized understanding. -Continued Trulicity 0.75 weekly x2 additional weeks, then start 1.5 mg weekly -Continued metformin 1000 mg BID -Extensively discussed pathophysiology of diabetes, recommended lifestyle interventions, dietary effects on blood sugar control -Counseled on s/sx of and management of hypoglycemia -Next A1C anticipated 02/2021.   ASCVD risk - primary prevention in patient with diabetes. Last LDL is not controlled. Low to moderate intensity statin indicated. Recently started on atorvastatin at last PCP visit on 5/18.  -Continued atorvastatin 10 mg daily  Written patient instructions provided.  Total time in face to face counseling 30 minutes.  Follow up Pharmacist Clinic Visit in 4 weeks.     Fabio Neighbors, PharmD, BCPS PGY2 Ambulatory Care Resident [facility]  Pharmacy

## 2020-12-30 ENCOUNTER — Other Ambulatory Visit: Payer: Self-pay

## 2021-01-12 NOTE — Progress Notes (Signed)
    S:     PCP: Dr. Delford Field PMH: T2DM, HLD   Patient arrives in good spirits. Presents for diabetes evaluation, education, and management Patient was referred and last seen by Primary Care Provider on 12/02/20. Pt last seen by pharmacy on 12/16/20. At that visit, pt was continued on Trulicity 0.75 mg weekly for an additional 2 weeks and then instructed to increase to 1.5 mg weekly.  Patient reports Diabetes was diagnosed in 2020.    Today, patient reports medication adherence with Trulicity 1.5 mg daily on Tuesdays. Reports having 2 pens left. Reports injection site bruise has completely healed and has not reoccurred with following injections. Reports he doesn't always take metformin due to stomach discomfort. Home fasting BG 110-150. Denies BG <70. Continues to report Trulicity has been curbing his diet and is drinking more water and eating smaller portions.   Family/Social History:  -Fhx: DM, HTN -Tobacco use: denies   Insurance coverage/medication affordability: dnbi   Medication adherence reported fair.   Current diabetes medications include: Trulicity 1.5 mg weekly, metformin 1000 mg BID Current hypertension medications include: none Current hyperlipidemia medications include: atorvastatin 10 mg daily, omega-3 fatty acids 3,000 mg daily  Patient denies hypoglycemic events.  Patient reported dietary habits: drinking more water and eating smaller portions   Patient-reported exercise habits: none   Patient denies nocturia (nighttime urination).  Patient reports neuropathy (nerve pain). Patient denies visual changes. Patient reports self foot exams.     O:  POCT: 124 (fasting)  Home fasting blood sugars: 110-150  2 hour post-meal/random blood sugars: none  Lab Results  Component Value Date   HGBA1C 12.1 (A) 12/02/2020   There were no vitals filed for this visit.  Lipid Panel     Component Value Date/Time   CHOL 162 12/02/2020 0916   TRIG 145 12/02/2020 0916   HDL 28  (L) 12/02/2020 0916   CHOLHDL 5.8 (H) 12/02/2020 0916   LDLCALC 108 (H) 12/02/2020 0916   Clinical Atherosclerotic Cardiovascular Disease (ASCVD): No  The ASCVD Risk score Denman George DC Jr., et al., 2013) failed to calculate for the following reasons:   The 2013 ASCVD risk score is only valid for ages 37 to 38   A/P: Diabetes longstanding currently uncontrolled, however home CBGs continue to improve since increasing Trulicity to 1.5 mg weekly. Medication adherence appears optimal. Will switch metformin IR to XR to minimize stomach discomfort. Encouraged patient to start checking blood glucose at least once 2-hours after his largest meal in addition to fasting sugars in the mornings and to bring glucometer to the next visit. Patient verbalized understanding. -Started metformin XR 500 mg - 2 tablets BID -Discontinued metformin 1000 mg BID -Continued Trulicity 1.5 mg weekly -Extensively discussed pathophysiology of diabetes, recommended lifestyle interventions, dietary effects on blood sugar control -Counseled on s/sx of and management of hypoglycemia -Next A1C anticipated 02/2021.   ASCVD risk - primary prevention in patient with diabetes. Last LDL is not controlled. Low to moderate intensity statin indicated. Recently started on atorvastatin at last PCP visit on 5/18. -Continued atorvastatin 10 mg daily -Recommend fasting lipid panel 3 months after starting atorvastatin (02/2021)   Written patient instructions provided.  Total time in face to face counseling 30 minutes.   Follow up PCP Clinic Visit in 2 weeks.     Fabio Neighbors, PharmD, BCPS PGY2 Ambulatory Care Resident [facility]  Pharmacy

## 2021-01-13 ENCOUNTER — Ambulatory Visit: Payer: Self-pay | Attending: Critical Care Medicine | Admitting: Pharmacist

## 2021-01-13 ENCOUNTER — Other Ambulatory Visit: Payer: Self-pay

## 2021-01-13 DIAGNOSIS — E1165 Type 2 diabetes mellitus with hyperglycemia: Secondary | ICD-10-CM

## 2021-01-13 LAB — GLUCOSE, POCT (MANUAL RESULT ENTRY): POC Glucose: 124 mg/dL — AB (ref 70–99)

## 2021-01-13 MED ORDER — METFORMIN HCL ER 500 MG PO TB24
1000.0000 mg | ORAL_TABLET | Freq: Two times a day (BID) | ORAL | 3 refills | Status: DC
  Filled 2021-01-13: qty 120, 30d supply, fill #0

## 2021-01-15 ENCOUNTER — Other Ambulatory Visit: Payer: Self-pay

## 2021-01-26 ENCOUNTER — Other Ambulatory Visit: Payer: Self-pay

## 2021-01-26 ENCOUNTER — Encounter: Payer: Self-pay | Admitting: Critical Care Medicine

## 2021-01-26 ENCOUNTER — Ambulatory Visit: Payer: Self-pay | Attending: Critical Care Medicine | Admitting: Critical Care Medicine

## 2021-01-26 VITALS — BP 132/90 | HR 89 | Wt 204.0 lb

## 2021-01-26 DIAGNOSIS — E1165 Type 2 diabetes mellitus with hyperglycemia: Secondary | ICD-10-CM

## 2021-01-26 DIAGNOSIS — E785 Hyperlipidemia, unspecified: Secondary | ICD-10-CM

## 2021-01-26 DIAGNOSIS — E1169 Type 2 diabetes mellitus with other specified complication: Secondary | ICD-10-CM

## 2021-01-26 LAB — POCT CBG (FASTING - GLUCOSE)-MANUAL ENTRY: Glucose Fasting, POC: 135 mg/dL — AB (ref 70–99)

## 2021-01-26 NOTE — Progress Notes (Signed)
Subjective:    Patient ID: Tyrone Steele, male    DOB: 05/24/1986, 35 y.o.   MRN: [id number]  34 y.o.M here to est pcp  12/02/2020 This patient is a 35 year old male seen for evaluation of type 2 diabetes reflux symptoms and to establish for primary care.  Patient's had diabetes diagnosed recently in 2020 and has been out of metformin now for about a month.  He was on 500 mg daily of the metformin and blood sugars at that time of been running the 200 range on arrival today blood sugar is 247 blood pressure 124/80  Patient has some numbness in the feet and does note chronic recurrent abdominal pain with reflux symptom complex otherwise no other complaints are generated   Patient does complain of some chest discomfort when refluxing and polyuria  01/26/2021 Patient seen in return follow-up complaining of bilateral foot pain with pressure on the foot areas.  He is wearing tight steel toed boots at work and does not wear comfortable socks and now has calluses on both great toes medially.  Patient was seen twice now by clinical pharmacy and his diabetic regimen has been adjusted to where he is now on 1000 mg twice daily of metformin XR and 1.5 mg weekly of Trulicity.  He brings his glucose meter and blood sugars in the 120 215 range on arrival he is 135.  He is diet still is not an adequate level.  He is eating a lot of cheese ham candy bars noodles and carbohydrate-based foods   The patient is due a pneumococcal vaccine and is willing to receive it at this visit   Past Medical History:  Diagnosis Date   Anemia    Diabetes mellitus without complication ([place])      Family History  Problem Relation Age of Onset   Diabetes Mother    Hypertension Father    Diabetes Father      Social History   Socioeconomic History   Marital status: Single    Spouse name: Not on file   Number of children: Not on file   Years of education: Not on file   Highest education level: Not on file   Occupational History   Not on file  Tobacco Use   Smoking status: Never   Smokeless tobacco: Never  Vaping Use   Vaping Use: Never used  Substance and Sexual Activity   Alcohol use: No   Drug use: No   Sexual activity: Yes  Other Topics Concern   Not on file  Social History Narrative   Not on file   Social Determinants of Health   Financial Resource Strain: Not on file  Food Insecurity: Not on file  Transportation Needs: Not on file  Physical Activity: Not on file  Stress: Not on file  Social Connections: Not on file  Intimate Partner Violence: Not on file     No Known Allergies   Outpatient Medications Prior to Visit  Medication Sig Dispense Refill   atorvastatin (LIPITOR) 10 MG tablet Take 1 tablet (10 mg total) by mouth daily. 90 tablet 3   Blood Glucose Monitoring Suppl (TRUE METRIX METER) w/Device KIT Use to measure blood sugar twice a day 1 kit 0   Dulaglutide (TRULICITY) 1.5 SV/7.7LT SOPN Inject 1.5 mg into the skin once a week. 2 mL 4   glucose blood (TRUE METRIX BLOOD GLUCOSE TEST) test strip Use as instructed 100 each 12   metFORMIN (GLUCOPHAGE-XR) 500 MG 24 hr tablet Take  2 tablets (1,000 mg total) by mouth 2 (two) times daily with a meal. 360 tablet 3   pantoprazole (PROTONIX) 40 MG tablet Take 1 tablet (40 mg total) by mouth daily. 30 tablet 3   TRUEplus Lancets 28G MISC Use to measure blood sugar twice a day 100 each 1   No facility-administered medications prior to visit.    Tablets been acting metformin/get your appointment table follow-up with diabetes Proliferative feet make sure there is no ulcers  Review of Systems  Constitutional:  Positive for fatigue.  HENT: Negative.    Eyes:  Negative for visual disturbance.  Respiratory:  Negative for chest tightness.   Cardiovascular:  Negative for chest pain.  Gastrointestinal:  Negative for abdominal pain.       Gerd  Endocrine: Negative for polyuria.  Genitourinary: Negative.   Musculoskeletal:  Negative.   Skin: Negative.   Neurological: Negative.   Psychiatric/Behavioral:  Negative for decreased concentration, dysphoric mood, hallucinations, self-injury, sleep disturbance and suicidal ideas. The patient is not nervous/anxious and is not hyperactive.       Objective:   Physical Exam Vitals:   01/26/21 0923  BP: 132/90  Pulse: 89  Weight: 204 lb (92.5 kg)    Gen: Pleasant, well-nourished, in no distress,  normal affect  ENT: No lesions,  mouth clear,  oropharynx clear, no postnasal drip, moderate periodontal disease  Neck: No JVD, no TMG, no carotid bruits  Lungs: No use of accessory muscles, no dullness to percussion, clear without rales or rhonchi  Cardiovascular: RRR, heart sounds normal, no murmur or gallops, no peripheral edema  Abdomen: soft and NT, no HSM,  BS normal  Musculoskeletal: No deformities, no cyanosis or clubbing  Neuro: alert, non focal  Skin: Warm, no lesions or rashes  Foot exam basically normal except for calluses on the medial aspect of both great toes   HbA1c, POC (controlled diabetic range)  Date Value Ref Range Status  12/02/2020 12.1 (A) 0.0 - 7.0 % Final  09/25/2019 6.6 0.0 - 7.0 % Final   HbA1c POC (<> result, manual entry)  Date Value Ref Range Status  01/03/2019 >15 4.0 - 5.6 % Final       Assessment & Plan:  I personally reviewed all images and lab data in the [facility] system as well as any outside material available during this office visit and agree with the  radiology impressions.   Type 2 diabetes mellitus (HCC) Type 2 diabetes with improved glycemic control he needs to improve his diet further we gave him education on proper diabetic diet at this visit and gave it to him and handout in his AVS  Patient will receive the pneumococcal 20 Valent vaccine  He was given resources to obtain an eye exam  We will told him to begin to find new boots to wear with a larger toe box and he was given diabetic socks  Hyperlipidemia  associated with type 2 diabetes mellitus ([place]) Continue cholesterol medications as prescribed   Roch was seen today for diabetes.  Diagnoses and all orders for this visit:  Type 2 diabetes mellitus with hyperglycemia, without long-term current use of insulin (HCC) -     Glucose (CBG), Fasting  Hyperlipidemia associated with type 2 diabetes mellitus ([place])

## 2021-01-26 NOTE — Progress Notes (Signed)
Patient here for f/u DM - foot pain  10/10 with standing

## 2021-01-26 NOTE — Assessment & Plan Note (Addendum)
>>  ASSESSMENT AND PLAN FOR TYPE 2 DIABETES MELLITUS (HCC) WRITTEN ON 01/26/2021 10:56 AM BY Casen Pryor E, MD  Type 2 diabetes with improved glycemic control he needs to improve his diet further we gave him education on proper diabetic diet at this visit and gave it to him and handout in his AVS  Patient will receive the pneumococcal 20 Valent vaccine  He was given resources to obtain an eye exam  We will told him to begin to find new boots to wear with a larger toe box and he was given diabetic socks  >>ASSESSMENT AND PLAN FOR HYPERLIPIDEMIA ASSOCIATED WITH TYPE 2 DIABETES MELLITUS (HCC) WRITTEN ON 01/26/2021 10:56 AM BY Meda Dudzinski E, MD  Continue cholesterol medications as prescribed

## 2021-01-26 NOTE — Assessment & Plan Note (Signed)
Continue cholesterol medications as prescribed

## 2021-01-26 NOTE — Patient Instructions (Signed)
No change in medications  A pneumonia vaccine was given  Continue to work on your diet as we discussed see attached  Return to see Dr. Delford Field in 6 weeks  Use skin moisturizer on the feet to soften the callus  Pick up the prescription financial application for assistance at the desk as you check out  Remember to get new work boots with a larger toe box and diabetic socks for the work

## 2021-02-02 ENCOUNTER — Other Ambulatory Visit: Payer: Self-pay

## 2021-02-05 ENCOUNTER — Ambulatory Visit: Payer: Self-pay

## 2021-03-02 ENCOUNTER — Other Ambulatory Visit: Payer: Self-pay

## 2021-03-09 ENCOUNTER — Ambulatory Visit: Payer: Self-pay | Attending: Critical Care Medicine | Admitting: Critical Care Medicine

## 2021-03-09 ENCOUNTER — Other Ambulatory Visit: Payer: Self-pay

## 2021-03-09 ENCOUNTER — Encounter: Payer: Self-pay | Admitting: Critical Care Medicine

## 2021-03-09 DIAGNOSIS — E785 Hyperlipidemia, unspecified: Secondary | ICD-10-CM

## 2021-03-09 DIAGNOSIS — E1165 Type 2 diabetes mellitus with hyperglycemia: Secondary | ICD-10-CM

## 2021-03-09 DIAGNOSIS — E1169 Type 2 diabetes mellitus with other specified complication: Secondary | ICD-10-CM

## 2021-03-09 DIAGNOSIS — E1140 Type 2 diabetes mellitus with diabetic neuropathy, unspecified: Secondary | ICD-10-CM

## 2021-03-09 MED ORDER — TRUE METRIX METER W/DEVICE KIT
PACK | 0 refills | Status: DC
  Filled 2021-03-09: qty 1, 1d supply, fill #0

## 2021-03-09 MED ORDER — PANTOPRAZOLE SODIUM 40 MG PO TBEC
40.0000 mg | DELAYED_RELEASE_TABLET | Freq: Every day | ORAL | 3 refills | Status: DC
  Filled 2021-03-09: qty 30, 30d supply, fill #0

## 2021-03-09 MED ORDER — GABAPENTIN 100 MG PO CAPS
300.0000 mg | ORAL_CAPSULE | Freq: Two times a day (BID) | ORAL | 1 refills | Status: DC
  Filled 2021-03-09: qty 60, 10d supply, fill #0

## 2021-03-09 NOTE — Patient Instructions (Signed)
Start Gabapentin 100mg  twice daily  A new glucose meter was sent in for you to pick up  Refill on pantoprozole /protonix was sent  STOP Atorvastatin due to side effects  Return to see Dr in October, my team will call you for an appt

## 2021-03-09 NOTE — Assessment & Plan Note (Addendum)
>>  ASSESSMENT AND PLAN FOR TYPE 2 DIABETES MELLITUS (HCC) WRITTEN ON 03/09/2021  6:18 PM BY Clora Ohmer E, MD  Continue metformin and Trulicity  >>ASSESSMENT AND PLAN FOR HYPERLIPIDEMIA ASSOCIATED WITH TYPE 2 DIABETES MELLITUS (HCC) WRITTEN ON 03/09/2021  6:18 PM BY Shamone Winzer E, MD  Patient's not able to tolerate statin therapy because of adverse side effects

## 2021-03-09 NOTE — Assessment & Plan Note (Signed)
Neuropathy involving both feet plan is to begin gabapentin 100 mg twice daily and push to improve glucose control

## 2021-03-09 NOTE — Assessment & Plan Note (Signed)
Patient's not able to tolerate statin therapy because of adverse side effects

## 2021-03-09 NOTE — Progress Notes (Signed)
Subjective:    Patient ID: Tyrone Steele, male    DOB: Jun 27, 1986, 35 y.o.   MRN: [id number] Virtual Visit via Video Note  I connected with Tyrone Steele on 03/09/21 at1000am by a video enabled telemedicine application and verified that I am speaking with the correct person using two identifiers.   Consent:  I discussed the limitations, risks, security and privacy concerns of performing an evaluation and management service by video visit and the availability of in person appointments. I also discussed with the patient that there may be a patient responsible charge related to this service. The patient expressed understanding and agreed to proceed.  Location of patient: Patient's at home  Location of provider: I am at home in my office  Persons participating in the televisit with the patient.   No one else on the call    History of Present Illness: 35 y.o.M here to est pcp  12/02/2020 This patient is a 35 year old male seen for evaluation of type 2 diabetes reflux symptoms and to establish for primary care.  Patient's had diabetes diagnosed recently in 2020 and has been out of metformin now for about a month.  He was on 500 mg daily of the metformin and blood sugars at that time of been running the 200 range on arrival today blood sugar is 247 blood pressure 124/80  Patient has some numbness in the feet and does note chronic recurrent abdominal pain with reflux symptom complex otherwise no other complaints are generated   Patient does complain of some chest discomfort when refluxing and polyuria  01/26/2021 Patient seen in return follow-up complaining of bilateral foot pain with pressure on the foot areas.  He is wearing tight steel toed boots at work and does not wear comfortable socks and now has calluses on both great toes medially.  Patient was seen twice now by clinical pharmacy and his diabetic regimen has been adjusted to where he is now on 1000 mg twice daily of metformin  XR and 1.5 mg weekly of Trulicity.  He brings his glucose meter and blood sugars in the 120 215 range on arrival he is 135.  He is diet still is not an adequate level.  He is eating a lot of cheese ham candy bars noodles and carbohydrate-based foods   The patient is due a pneumococcal vaccine and is willing to receive it at this visit  03/09/2021 Patient states since the last visit his blood sugars have improved they are in the 100-1 20 range.  Note he had an A1c of 12 in May he is due another A1c soon.  Patient is also compliant with atorvastatin and has improved his diet.  He does need a new meter as its not been sometimes accurate.  Patient maintains Trulicity and metformin.  He is yet to go get an eye exam but knows to receive 1.  There are no other complaints at this time other than continued numbness and pain in his feet from likely diabetic neuropathy.    Past Medical History:  Diagnosis Date   Anemia    Diabetes mellitus without complication ([place])      Family History  Problem Relation Age of Onset   Diabetes Mother    Hypertension Father    Diabetes Father      Social History   Socioeconomic History   Marital status: Single    Spouse name: Not on file   Number of children: Not on file   Years of  education: Not on file   Highest education level: Not on file  Occupational History   Not on file  Tobacco Use   Smoking status: Never   Smokeless tobacco: Never  Vaping Use   Vaping Use: Never used  Substance and Sexual Activity   Alcohol use: No   Drug use: No   Sexual activity: Yes  Other Topics Concern   Not on file  Social History Narrative   Not on file   Social Determinants of Health   Financial Resource Strain: Not on file  Food Insecurity: Not on file  Transportation Needs: Not on file  Physical Activity: Not on file  Stress: Not on file  Social Connections: Not on file  Intimate Partner Violence: Not on file     No Known Allergies   Outpatient  Medications Prior to Visit  Medication Sig Dispense Refill   Dulaglutide (TRULICITY) 1.5 FA/2.1HY SOPN Inject 1.5 mg into the skin once a week. 2 mL 4   glucose blood (TRUE METRIX BLOOD GLUCOSE TEST) test strip Use as instructed 100 each 12   metFORMIN (GLUCOPHAGE-XR) 500 MG 24 hr tablet Take 2 tablets (1,000 mg total) by mouth 2 (two) times daily with a meal. 360 tablet 3   TRUEplus Lancets 28G MISC Use to measure blood sugar twice a day 100 each 1   atorvastatin (LIPITOR) 10 MG tablet Take 1 tablet (10 mg total) by mouth daily. 90 tablet 3   Blood Glucose Monitoring Suppl (TRUE METRIX METER) w/Device KIT Use to measure blood sugar twice a day 1 kit 0   pantoprazole (PROTONIX) 40 MG tablet Take 1 tablet (40 mg total) by mouth daily. 30 tablet 3   No facility-administered medications prior to visit.    Tablets been acting metformin/get your appointment table follow-up with diabetes Proliferative feet make sure there is no ulcers  Review of Systems  Constitutional:  Positive for fatigue.  HENT: Negative.    Eyes:  Negative for visual disturbance.  Respiratory:  Negative for chest tightness.   Cardiovascular:  Negative for chest pain.  Gastrointestinal:  Negative for abdominal pain.       Gerd  Endocrine: Negative for polyuria.  Genitourinary: Negative.   Musculoskeletal: Negative.   Skin: Negative.   Neurological:  Positive for numbness.  Psychiatric/Behavioral:  Negative for decreased concentration, dysphoric mood, hallucinations, self-injury, sleep disturbance and suicidal ideas. The patient is not nervous/anxious and is not hyperactive.       Objective:   Physical Exam There were no vitals filed for this visit.  No exam this is a video visit patient is seen and is in no acute distress  HbA1c, POC (controlled diabetic range)  Date Value Ref Range Status  12/02/2020 12.1 (A) 0.0 - 7.0 % Final  09/25/2019 6.6 0.0 - 7.0 % Final   HbA1c POC (<> result, manual entry)  Date  Value Ref Range Status  01/03/2019 >15 4.0 - 5.6 % Final       Assessment & Plan:  I personally reviewed all images and lab data in the [facility] system as well as any outside material available during this office visit and agree with the  radiology impressions.   Neuropathy due to type 2 diabetes mellitus (HCC) Neuropathy involving both feet plan is to begin gabapentin 100 mg twice daily and push to improve glucose control  Hyperlipidemia associated with type 2 diabetes mellitus ([place]) Patient's not able to tolerate statin therapy because of adverse side effects  Type 2 diabetes  mellitus ([place]) Continue metformin and Trulicity   Diagnoses and all orders for this visit:  Neuropathy due to type 2 diabetes mellitus ([place])  Hyperlipidemia associated with type 2 diabetes mellitus ([place])  Type 2 diabetes mellitus with hyperglycemia, without long-term current use of insulin ([place])  Other orders -     Blood Glucose Monitoring Suppl (TRUE METRIX METER) w/Device KIT; Use to measure blood sugar twice a day -     pantoprazole (PROTONIX) 40 MG tablet; Take 1 tablet (40 mg total) by mouth daily. -     gabapentin (NEURONTIN) 100 MG capsule; Take 3 capsules (300 mg total) by mouth 2 (two) times daily.    Follow Up Instructions: Patient knows a follow-up exam will be occurring face-to-face in October and refills on medications sent to the pharmacy along with a new glucose meter he will also begin gabapentin 100 mg twice daily for neuropathy   I discussed the assessment and treatment plan with the patient. The patient was provided an opportunity to ask questions and all were answered. The patient agreed with the plan and demonstrated an understanding of the instructions.   The patient was advised to call back or seek an in-person evaluation if the symptoms worsen or if the condition fails to improve as anticipated.  I provided 35 minutes of non-face-to-face time during this encounter  including  median  intraservice time , review of notes, labs, imaging, medications  and explaining diagnosis and management to the patient .    Asencion Noble, MD

## 2021-03-09 NOTE — Addendum Note (Signed)
Addended by: Storm Frisk on: 03/09/2021 06:21 PM   Modules accepted: Level of Service

## 2021-05-10 NOTE — Progress Notes (Signed)
Established Patient Office Visit  Subjective:  Patient ID: Tyrone Steele, male    DOB: Oct 28, 1985  Age: 35 y.o. MRN: [id number]  CC:  Chief Complaint  Patient presents with   Diabetes    HPI Tyrone Steele presents for type 2 diabetes hyperlipidemia and neuropathy due to diabetes  Note patient is yet to receive an eye exam was given resources for this.  Patient does maintain metformin and Trulicity and on arrival hemoglobin A1c is 6.5.  Patient states the neuropathy is slowly improving with reduction in sugars  Patient's not able to tolerate statin therapy because of adverse side effects he is following a healthy diet  Patient does agree to receive the flu vaccine at this visit   Past Medical History:  Diagnosis Date   Anemia    Diabetes mellitus without complication ([place])     Past Surgical History:  Procedure Laterality Date   NO PAST SURGERIES      Family History  Problem Relation Age of Onset   Diabetes Mother    Hypertension Father    Diabetes Father     Social History   Socioeconomic History   Marital status: Single    Spouse name: Not on file   Number of children: Not on file   Years of education: Not on file   Highest education level: Not on file  Occupational History   Not on file  Tobacco Use   Smoking status: Never   Smokeless tobacco: Never  Vaping Use   Vaping Use: Never used  Substance and Sexual Activity   Alcohol use: No   Drug use: No   Sexual activity: Yes  Other Topics Concern   Not on file  Social History Narrative   Not on file   Social Determinants of Health   Financial Resource Strain: Not on file  Food Insecurity: Not on file  Transportation Needs: Not on file  Physical Activity: Not on file  Stress: Not on file  Social Connections: Not on file  Intimate Partner Violence: Not on file    Outpatient Medications Prior to Visit  Medication Sig Dispense Refill   Blood Glucose Monitoring Suppl (TRUE METRIX METER)  w/Device KIT Use to measure blood sugar twice a day 1 kit 0   Dulaglutide (TRULICITY) 1.5 HQ/4.6NG SOPN Inject 1.5 mg into the skin once a week. 2 mL 4   gabapentin (NEURONTIN) 100 MG capsule Take 3 capsules (300 mg total) by mouth 2 (two) times daily. 60 capsule 1   glucose blood (TRUE METRIX BLOOD GLUCOSE TEST) test strip Use as instructed 100 each 12   metFORMIN (GLUCOPHAGE-XR) 500 MG 24 hr tablet Take 2 tablets (1,000 mg total) by mouth 2 (two) times daily with a meal. 360 tablet 3   pantoprazole (PROTONIX) 40 MG tablet Take 1 tablet (40 mg total) by mouth daily. 30 tablet 3   TRUEplus Lancets 28G MISC Use to measure blood sugar twice a day 100 each 1   No facility-administered medications prior to visit.    Allergies  Allergen Reactions   Statins Nausea Only and Other (See Comments)    Severe fatigue    ROS Review of Systems  Constitutional: Negative.   HENT: Negative.  Negative for ear pain, postnasal drip, rhinorrhea, sinus pressure, sore throat, trouble swallowing and voice change.   Eyes: Negative.   Respiratory: Negative.  Negative for apnea, cough, choking, chest tightness, shortness of breath, wheezing and stridor.   Cardiovascular: Negative.  Negative for  chest pain, palpitations and leg swelling.  Gastrointestinal: Negative.  Negative for abdominal distention, abdominal pain, nausea and vomiting.  Genitourinary: Negative.   Musculoskeletal: Negative.  Negative for arthralgias and myalgias.  Skin: Negative.  Negative for rash.  Allergic/Immunologic: Negative.  Negative for environmental allergies and food allergies.  Neurological: Negative.  Negative for dizziness, syncope, weakness and headaches.  Hematological: Negative.  Negative for adenopathy. Does not bruise/bleed easily.  Psychiatric/Behavioral: Negative.  Negative for agitation and sleep disturbance. The patient is not nervous/anxious.      Objective:    Physical Exam Vitals reviewed.  Constitutional:       Appearance: Normal appearance. He is well-developed. He is not diaphoretic.  HENT:     Head: Normocephalic and atraumatic.     Nose: No nasal deformity, septal deviation, mucosal edema or rhinorrhea.     Right Sinus: No maxillary sinus tenderness or frontal sinus tenderness.     Left Sinus: No maxillary sinus tenderness or frontal sinus tenderness.     Mouth/Throat:     Pharynx: No oropharyngeal exudate.  Eyes:     General: No scleral icterus.    Conjunctiva/sclera: Conjunctivae normal.     Pupils: Pupils are equal, round, and reactive to light.  Neck:     Thyroid: No thyromegaly.     Vascular: No carotid bruit or JVD.     Trachea: Trachea normal. No tracheal tenderness or tracheal deviation.  Cardiovascular:     Rate and Rhythm: Normal rate and regular rhythm.     Chest Wall: PMI is not displaced.     Pulses: Normal pulses. No decreased pulses.     Heart sounds: Normal heart sounds, S1 normal and S2 normal. Heart sounds not distant. No murmur heard. No systolic murmur is present.  No diastolic murmur is present.    No friction rub. No gallop. No S3 or S4 sounds.  Pulmonary:     Effort: No tachypnea, accessory muscle usage or respiratory distress.     Breath sounds: No stridor. No decreased breath sounds, wheezing, rhonchi or rales.  Chest:     Chest wall: No tenderness.  Abdominal:     General: Bowel sounds are normal. There is no distension.     Palpations: Abdomen is soft. Abdomen is not rigid.     Tenderness: There is no abdominal tenderness. There is no guarding or rebound.  Musculoskeletal:        General: Normal range of motion.     Cervical back: Normal range of motion and neck supple. No edema, erythema or rigidity. No muscular tenderness. Normal range of motion.  Lymphadenopathy:     Head:     Right side of head: No submental or submandibular adenopathy.     Left side of head: No submental or submandibular adenopathy.     Cervical: No cervical adenopathy.  Skin:     General: Skin is warm and dry.     Coloration: Skin is not pale.     Findings: No rash.     Nails: There is no clubbing.  Neurological:     Mental Status: He is alert and oriented to person, place, and time.     Sensory: No sensory deficit.  Psychiatric:        Speech: Speech normal.        Behavior: Behavior normal.    BP 130/84   Pulse 77   Resp 16   Wt 211 lb 9.6 oz (96 kg)   SpO2 91%  BMI 31.25 kg/m  Wt Readings from Last 3 Encounters:  05/11/21 211 lb 9.6 oz (96 kg)  01/26/21 204 lb (92.5 kg)  12/02/20 210 lb 12.8 oz (95.6 kg)     Health Maintenance Due  Topic Date Due   OPHTHALMOLOGY EXAM  Never done    There are no preventive care reminders to display for this patient.  No results found for: TSH Lab Results  Component Value Date   WBC 4.3 12/02/2020   HGB 15.0 12/02/2020   HCT 46.0 12/02/2020   MCV 82 12/02/2020   PLT 207 12/02/2020   Lab Results  Component Value Date   NA 140 12/02/2020   K 3.9 12/02/2020   CO2 23 12/02/2020   GLUCOSE 237 (H) 12/02/2020   BUN 11 12/02/2020   CREATININE 0.98 12/02/2020   BILITOT 0.4 12/02/2020   ALKPHOS 69 12/02/2020   AST 34 12/02/2020   ALT 50 (H) 12/02/2020   PROT 7.1 12/02/2020   ALBUMIN 4.6 12/02/2020   CALCIUM 9.2 12/02/2020   ANIONGAP 12 08/09/2020   EGFR 103 12/02/2020   Lab Results  Component Value Date   CHOL 162 12/02/2020   Lab Results  Component Value Date   HDL 28 (L) 12/02/2020   Lab Results  Component Value Date   LDLCALC 108 (H) 12/02/2020   Lab Results  Component Value Date   TRIG 145 12/02/2020   Lab Results  Component Value Date   CHOLHDL 5.8 (H) 12/02/2020   Lab Results  Component Value Date   HGBA1C 12.1 (A) 12/02/2020      Assessment & Plan:   Problem List Items Addressed This Visit       Endocrine   Type 2 diabetes mellitus ([place]) - Primary    Type 2 diabetes controlled at this time  Continue metformin and Trulicity  Patient given EYE resources to obtain an  eye exam      Relevant Medications   Dulaglutide (TRULICITY) 1.5 LS/9.3TD SOPN   metFORMIN (GLUCOPHAGE-XR) 500 MG 24 hr tablet   Other Relevant Orders   HgB A1c   Hyperlipidemia associated with type 2 diabetes mellitus ([place])    Recommended patient try omega-3 fish oil daily      Relevant Medications   Dulaglutide (TRULICITY) 1.5 SK/8.7GO SOPN   metFORMIN (GLUCOPHAGE-XR) 500 MG 24 hr tablet   Neuropathy due to type 2 diabetes mellitus ([facility])    Continue gabapentin      Relevant Medications   Dulaglutide (TRULICITY) 1.5 TL/5.7WI SOPN   metFORMIN (GLUCOPHAGE-XR) 500 MG 24 hr tablet   Other Visit Diagnoses     Need for immunization against influenza       Relevant Orders   Flu Vaccine QUAD 52moIM (Fluarix, Fluzone & Alfiuria Quad PF) (Completed)       Meds ordered this encounter  Medications   Dulaglutide (TRULICITY) 1.5 MOM/3.5DHSOPN    Sig: Inject 1.5 mg into the skin once a week.    Dispense:  2 mL    Refill:  4   gabapentin (NEURONTIN) 100 MG capsule    Sig: Take 3 capsules (300 mg total) by mouth 2 (two) times daily.    Dispense:  60 capsule    Refill:  1   metFORMIN (GLUCOPHAGE-XR) 500 MG 24 hr tablet    Sig: Take 2 tablets (1,000 mg total) by mouth 2 (two) times daily with a meal.    Dispense:  360 tablet    Refill:  3   pantoprazole (PROTONIX)  40 MG tablet    Sig: Take 1 tablet (40 mg total) by mouth daily.    Dispense:  30 tablet    Refill:  3   TRUEplus Lancets 28G MISC    Sig: Use to measure blood sugar twice a day    Dispense:  100 each    Refill:  1   glucose blood (TRUE METRIX BLOOD GLUCOSE TEST) test strip    Sig: Use as instructed    Dispense:  100 each    Refill:  12    Follow-up: Return in about 3 months (around 08/11/2021).    Asencion Noble, MD

## 2021-05-11 ENCOUNTER — Ambulatory Visit: Payer: Self-pay | Attending: Critical Care Medicine | Admitting: Critical Care Medicine

## 2021-05-11 ENCOUNTER — Encounter: Payer: Self-pay | Admitting: Critical Care Medicine

## 2021-05-11 ENCOUNTER — Other Ambulatory Visit: Payer: Self-pay

## 2021-05-11 VITALS — BP 130/84 | HR 77 | Resp 16 | Wt 211.6 lb

## 2021-05-11 DIAGNOSIS — E785 Hyperlipidemia, unspecified: Secondary | ICD-10-CM

## 2021-05-11 DIAGNOSIS — E1165 Type 2 diabetes mellitus with hyperglycemia: Secondary | ICD-10-CM

## 2021-05-11 DIAGNOSIS — E1140 Type 2 diabetes mellitus with diabetic neuropathy, unspecified: Secondary | ICD-10-CM

## 2021-05-11 DIAGNOSIS — Z23 Encounter for immunization: Secondary | ICD-10-CM

## 2021-05-11 DIAGNOSIS — E1169 Type 2 diabetes mellitus with other specified complication: Secondary | ICD-10-CM

## 2021-05-11 LAB — POCT GLYCOSYLATED HEMOGLOBIN (HGB A1C): HbA1c, POC (controlled diabetic range): 6.8 % (ref 0.0–7.0)

## 2021-05-11 MED ORDER — GABAPENTIN 100 MG PO CAPS
300.0000 mg | ORAL_CAPSULE | Freq: Two times a day (BID) | ORAL | 1 refills | Status: DC
  Filled 2021-05-11 – 2021-05-19 (×2): qty 60, 10d supply, fill #0

## 2021-05-11 MED ORDER — METFORMIN HCL ER 500 MG PO TB24
1000.0000 mg | ORAL_TABLET | Freq: Two times a day (BID) | ORAL | 3 refills | Status: DC
  Filled 2021-05-11 – 2021-05-19 (×2): qty 120, 30d supply, fill #0

## 2021-05-11 MED ORDER — PANTOPRAZOLE SODIUM 40 MG PO TBEC
40.0000 mg | DELAYED_RELEASE_TABLET | Freq: Every day | ORAL | 3 refills | Status: DC
  Filled 2021-05-11 – 2021-05-19 (×2): qty 30, 30d supply, fill #0

## 2021-05-11 MED ORDER — TRULICITY 1.5 MG/0.5ML ~~LOC~~ SOAJ
1.5000 mg | SUBCUTANEOUS | 4 refills | Status: DC
  Filled 2021-05-11 – 2021-05-19 (×2): qty 2, 28d supply, fill #0

## 2021-05-11 MED ORDER — TRUEPLUS LANCETS 28G MISC
1 refills | Status: AC
  Filled 2021-05-11 – 2021-05-19 (×2): qty 100, 50d supply, fill #0

## 2021-05-11 MED ORDER — TRUE METRIX BLOOD GLUCOSE TEST VI STRP
ORAL_STRIP | 12 refills | Status: DC
  Filled 2021-05-11: qty 100, 25d supply, fill #0
  Filled 2021-05-19: qty 100, 50d supply, fill #0

## 2021-05-11 NOTE — Assessment & Plan Note (Addendum)
>>  ASSESSMENT AND PLAN FOR TYPE 2 DIABETES MELLITUS ([facility]) WRITTEN ON 05/11/2021  4:50 PM BY Encarnacion Scioneaux E, MD  Type 2 diabetes controlled at this time  Continue metformin and Trulicity  Patient given EYE resources to obtain an eye exam  >>ASSESSMENT AND PLAN FOR HYPERLIPIDEMIA ASSOCIATED WITH TYPE 2 DIABETES MELLITUS ([facility]) WRITTEN ON 05/11/2021  4:50 PM BY Townes Fuhs E, MD  Recommended patient try omega-3 fish oil daily

## 2021-05-11 NOTE — Assessment & Plan Note (Signed)
Continue gabapentin.

## 2021-05-11 NOTE — Assessment & Plan Note (Signed)
Recommended patient try omega-3 fish oil daily

## 2021-05-11 NOTE — Patient Instructions (Signed)
Please pick up financial assistance application and consider obtaining necessary documents and make appointment with financial counselor for orange card and [place] discount  No change in medications refill sent to our pharmacy  Dental resources and I resources given for consideration to get an eye exam and a dental cleansing  Keep up the good work with your diet program  Flu vaccine was given  Return to Dr. Delford Field 3 months  Go to a shoe store like the good feet store or the shoe market on [address] to have your feet fitted with a good fitting pair of shoes for work

## 2021-05-18 ENCOUNTER — Other Ambulatory Visit: Payer: Self-pay

## 2021-05-19 ENCOUNTER — Other Ambulatory Visit: Payer: Self-pay

## 2021-08-11 ENCOUNTER — Encounter: Payer: Self-pay | Admitting: Critical Care Medicine

## 2021-08-11 ENCOUNTER — Other Ambulatory Visit: Payer: Self-pay

## 2021-08-11 ENCOUNTER — Telehealth: Payer: Self-pay | Admitting: Critical Care Medicine

## 2021-08-11 ENCOUNTER — Ambulatory Visit (HOSPITAL_BASED_OUTPATIENT_CLINIC_OR_DEPARTMENT_OTHER): Payer: Self-pay | Admitting: Critical Care Medicine

## 2021-08-11 DIAGNOSIS — E1165 Type 2 diabetes mellitus with hyperglycemia: Secondary | ICD-10-CM

## 2021-08-11 MED ORDER — METFORMIN HCL ER 500 MG PO TB24
1000.0000 mg | ORAL_TABLET | Freq: Two times a day (BID) | ORAL | 3 refills | Status: DC
Start: 2021-08-11 — End: 2023-04-21
  Filled 2021-08-11: qty 120, 30d supply, fill #0

## 2021-08-11 MED ORDER — TRULICITY 1.5 MG/0.5ML ~~LOC~~ SOAJ
1.5000 mg | SUBCUTANEOUS | 4 refills | Status: DC
  Filled 2021-08-11: qty 2, 28d supply, fill #0
  Filled 2021-09-03 – 2021-10-11 (×2): qty 2, 28d supply, fill #1

## 2021-08-11 MED ORDER — GABAPENTIN 100 MG PO CAPS
300.0000 mg | ORAL_CAPSULE | Freq: Two times a day (BID) | ORAL | 1 refills | Status: DC
  Filled 2021-08-11: qty 60, 10d supply, fill #0

## 2021-08-11 MED ORDER — TRUE METRIX BLOOD GLUCOSE TEST VI STRP
ORAL_STRIP | 12 refills | Status: DC
  Filled 2021-08-11: qty 100, 30d supply, fill #0

## 2021-08-11 MED ORDER — PANTOPRAZOLE SODIUM 40 MG PO TBEC
40.0000 mg | DELAYED_RELEASE_TABLET | Freq: Every day | ORAL | 3 refills | Status: DC
  Filled 2021-08-11: qty 30, 30d supply, fill #0

## 2021-08-11 NOTE — Telephone Encounter (Signed)
I emailed it  to him  it is in his chart  pls print "sign" my name and mail to him

## 2021-08-11 NOTE — Progress Notes (Signed)
Established Patient Office Visit  Subjective:  Patient ID: Tyrone Steele, male    DOB: 1985/07/30  Age: 36 y.o. MRN: [id number] Virtual Visit via Telephone Note  I connected with Tyrone Steele on 08/11/21 at 10:00 AM EST by telephone and verified that I am speaking with the correct person using two identifiers.   Consent:  I discussed the limitations, risks, security and privacy concerns of performing an evaluation and management service by telephone and the availability of in person appointments. I also discussed with the patient that there may be a patient responsible charge related to this service. The patient expressed understanding and agreed to proceed.  Location of patient: Patient's at home  Location of provider: I am in my office  Persons participating in the televisit with the patient.    No one else on the call   History of Present Illness:     CC: Primary care follow-up  HPI Tyrone Steele presents for primary care follow-up this is a telephone visit he does not have technology to achieve video.  This patient is seen for type 2 diabetes and follow-up from an October 2022 face-to-face visit.  He has been doing relatively well his blood sugar runs around 150 on average and he has no real active complaints.  His biggest issue is he has a new job and he has to be on the job from 8 to 4 PM and they are very difficult with him if he takes time off for medical visits.  He needed a letter for me today indicating the need to come in for laboratory and for direct exams.  He has a follow-up visit with our PA Mcclung February 15 there is actually a visit in April with me as well face-to-face  He has no other complaints  Past Medical History:  Diagnosis Date   Anemia    Diabetes mellitus without complication ([place])     Past Surgical History:  Procedure Laterality Date   NO PAST SURGERIES      Family History  Problem Relation Age of Onset   Diabetes Mother     Hypertension Father    Diabetes Father     Social History   Socioeconomic History   Marital status: Single    Spouse name: Not on file   Number of children: Not on file   Years of education: Not on file   Highest education level: Not on file  Occupational History   Not on file  Tobacco Use   Smoking status: Never   Smokeless tobacco: Never  Vaping Use   Vaping Use: Never used  Substance and Sexual Activity   Alcohol use: No   Drug use: No   Sexual activity: Yes  Other Topics Concern   Not on file  Social History Narrative   Not on file   Social Determinants of Health   Financial Resource Strain: Not on file  Food Insecurity: Not on file  Transportation Needs: Not on file  Physical Activity: Not on file  Stress: Not on file  Social Connections: Not on file  Intimate Partner Violence: Not on file    Outpatient Medications Prior to Visit  Medication Sig Dispense Refill   Blood Glucose Monitoring Suppl (TRUE METRIX METER) w/Device KIT Use to measure blood sugar twice a day 1 kit 0   TRUEplus Lancets 28G MISC Use to measure blood sugar twice a day 100 each 1   Dulaglutide (TRULICITY) 1.5 WL/7.9GX SOPN Inject 1.5 mg  into the skin once a week. 2 mL 4   gabapentin (NEURONTIN) 100 MG capsule Take 3 capsules (300 mg total) by mouth 2 (two) times daily. 60 capsule 1   glucose blood (TRUE METRIX BLOOD GLUCOSE TEST) test strip Use as instructed 100 each 12   metFORMIN (GLUCOPHAGE-XR) 500 MG 24 hr tablet Take 2 tablets (1,000 mg total) by mouth 2 (two) times daily with a meal. 360 tablet 3   pantoprazole (PROTONIX) 40 MG tablet Take 1 tablet (40 mg total) by mouth daily. 30 tablet 3   No facility-administered medications prior to visit.    Allergies  Allergen Reactions   Statins Nausea Only and Other (See Comments)    Severe fatigue    ROS Review of Systems  Constitutional: Negative.   HENT: Negative.  Negative for ear pain, postnasal drip, rhinorrhea, sinus pressure,  sore throat, trouble swallowing and voice change.   Eyes: Negative.   Respiratory: Negative.  Negative for apnea, cough, choking, chest tightness, shortness of breath, wheezing and stridor.   Cardiovascular: Negative.  Negative for chest pain, palpitations and leg swelling.  Gastrointestinal: Negative.  Negative for abdominal distention, abdominal pain, nausea and vomiting.  Genitourinary: Negative.   Musculoskeletal: Negative.  Negative for arthralgias and myalgias.  Skin: Negative.  Negative for rash.  Allergic/Immunologic: Negative.  Negative for environmental allergies and food allergies.  Neurological: Negative.  Negative for dizziness, syncope, weakness and headaches.  Hematological: Negative.  Negative for adenopathy. Does not bruise/bleed easily.  Psychiatric/Behavioral: Negative.  Negative for agitation and sleep disturbance. The patient is not nervous/anxious.      Objective:    Physical Exam No exam this is a phone visit There were no vitals taken for this visit. Wt Readings from Last 3 Encounters:  05/11/21 211 lb 9.6 oz (96 kg)  01/26/21 204 lb (92.5 kg)  12/02/20 210 lb 12.8 oz (95.6 kg)     Health Maintenance Due  Topic Date Due   OPHTHALMOLOGY EXAM  Never done    There are no preventive care reminders to display for this patient.  No results found for: TSH Lab Results  Component Value Date   WBC 4.3 12/02/2020   HGB 15.0 12/02/2020   HCT 46.0 12/02/2020   MCV 82 12/02/2020   PLT 207 12/02/2020   Lab Results  Component Value Date   NA 140 12/02/2020   K 3.9 12/02/2020   CO2 23 12/02/2020   GLUCOSE 237 (H) 12/02/2020   BUN 11 12/02/2020   CREATININE 0.98 12/02/2020   BILITOT 0.4 12/02/2020   ALKPHOS 69 12/02/2020   AST 34 12/02/2020   ALT 50 (H) 12/02/2020   PROT 7.1 12/02/2020   ALBUMIN 4.6 12/02/2020   CALCIUM 9.2 12/02/2020   ANIONGAP 12 08/09/2020   EGFR 103 12/02/2020   Lab Results  Component Value Date   CHOL 162 12/02/2020   Lab  Results  Component Value Date   HDL 28 (L) 12/02/2020   Lab Results  Component Value Date   LDLCALC 108 (H) 12/02/2020   Lab Results  Component Value Date   TRIG 145 12/02/2020   Lab Results  Component Value Date   CHOLHDL 5.8 (H) 12/02/2020   Lab Results  Component Value Date   HGBA1C 6.8 05/11/2021      Assessment & Plan:   Problem List Items Addressed This Visit       Endocrine   Type 2 diabetes mellitus ([place])    Type 2 diabetes appears to be  relatively well controlled on Trulicity and metformin we will not make changes provide refills  Patient to come in for a direct exam February 15 with the PA Mcclung  Patient's encouraged to keep an eye exam appointment      Relevant Medications   Dulaglutide (TRULICITY) 1.5 GN/5.6OZ SOPN   metFORMIN (GLUCOPHAGE-XR) 500 MG 24 hr tablet    Meds ordered this encounter  Medications   Dulaglutide (TRULICITY) 1.5 HY/8.6VH SOPN    Sig: Inject 1.5 mg into the skin once a week.    Dispense:  2 mL    Refill:  4   gabapentin (NEURONTIN) 100 MG capsule    Sig: Take 3 capsules (300 mg total) by mouth 2 (two) times daily.    Dispense:  60 capsule    Refill:  1   glucose blood (TRUE METRIX BLOOD GLUCOSE TEST) test strip    Sig: Use as instructed    Dispense:  100 each    Refill:  12   metFORMIN (GLUCOPHAGE-XR) 500 MG 24 hr tablet    Sig: Take 2 tablets (1,000 mg total) by mouth 2 (two) times daily with a meal.    Dispense:  360 tablet    Refill:  3   pantoprazole (PROTONIX) 40 MG tablet    Sig: Take 1 tablet (40 mg total) by mouth daily.    Dispense:  30 tablet    Refill:  3   Follow Up Instructions: Patient knows a direct exam will occur on February 15 in the office face-to-face with PA Mcclung he is to keep this appointment I will see him later after he is seen by the PA as he already has this established appointment he knows medication refills will be sent for him on all medicines   I discussed the assessment and  treatment plan with the patient. The patient was provided an opportunity to ask questions and all were answered. The patient agreed with the plan and demonstrated an understanding of the instructions.   The patient was advised to call back or seek an in-person evaluation if the symptoms worsen or if the condition fails to improve as anticipated.  I provided 29 minutes of non-face-to-face time during this encounter  including  median intraservice time , review of notes, labs, imaging, medications  and explaining diagnosis and management to the patient .    Asencion Noble, MD

## 2021-08-11 NOTE — Telephone Encounter (Signed)
Copied from [place] [id number]. Topic: General - Inquiry >> Aug 11, 2021 10:34 AM Loma [place] wrote: Reason for CRM: pt was sch for an appt today and did not realize chg to virtual and missed appt. He is requesting note form dr explaining as he now is having to get off work again 2/15. He states he needs a note saying why he could not see dr today and that he will need to be off again. Pt says his job is very strict. Pt states that his wife will come get note at new office. FU # is [phone]

## 2021-08-11 NOTE — Assessment & Plan Note (Signed)
Type 2 diabetes appears to be relatively well controlled on Trulicity and metformin we will not make changes provide refills  Patient to come in for a direct exam February 15 with the PA Mcclung  Patient's encouraged to keep an eye exam appointment

## 2021-08-11 NOTE — Telephone Encounter (Signed)
Pt requesting work for visit , do you want me to write and send it from here or you write it and let him pick it up ?

## 2021-08-11 NOTE — Telephone Encounter (Signed)
Ok will do.

## 2021-08-12 ENCOUNTER — Other Ambulatory Visit: Payer: Self-pay

## 2021-09-01 ENCOUNTER — Encounter: Payer: Self-pay | Admitting: Physician Assistant

## 2021-09-01 ENCOUNTER — Ambulatory Visit: Payer: Self-pay | Attending: Physician Assistant | Admitting: Physician Assistant

## 2021-09-01 ENCOUNTER — Other Ambulatory Visit: Payer: Self-pay

## 2021-09-01 VITALS — BP 138/87 | HR 87 | Resp 16 | Wt 217.8 lb

## 2021-09-01 DIAGNOSIS — R030 Elevated blood-pressure reading, without diagnosis of hypertension: Secondary | ICD-10-CM

## 2021-09-01 DIAGNOSIS — E1165 Type 2 diabetes mellitus with hyperglycemia: Secondary | ICD-10-CM

## 2021-09-01 DIAGNOSIS — E1140 Type 2 diabetes mellitus with diabetic neuropathy, unspecified: Secondary | ICD-10-CM

## 2021-09-01 LAB — POCT GLYCOSYLATED HEMOGLOBIN (HGB A1C): HbA1c, POC (controlled diabetic range): 7.4 % — AB (ref 0.0–7.0)

## 2021-09-01 LAB — GLUCOSE, POCT (MANUAL RESULT ENTRY): POC Glucose: 139 mg/dl (ref 70–99)

## 2021-09-01 MED ORDER — LISINOPRIL 5 MG PO TABS: 5.0000 mg | ORAL_TABLET | Freq: Every day | ORAL | 3 refills | Status: DC

## 2021-09-01 NOTE — Progress Notes (Signed)
Patient ID: Tyrone Steele, male   DOB: 18-Jan-1986, 36 y.o.   MRN: [id number]   Tyrone Steele, is a 36 y.o. male  FBX:[id number]  NVB:[id number]  DOB - [birth year]  Chief Complaint  Patient presents with   Diabetes       Subjective:   Tyrone Steele is a 36 y.o. male here today for diabetes check.  He has only been taking his meds about 80% of the time.  He has also not been adhering to diabetic diet.  He is not on an ace inhibitor.  He does not have any new issues or concerns  No problems updated.  ALLERGIES: Allergies  Allergen Reactions   Statins Nausea Only and Other (See Comments)    Severe fatigue    PAST MEDICAL HISTORY: Past Medical History:  Diagnosis Date   Anemia    Diabetes mellitus without complication ([place])     MEDICATIONS AT HOME: Prior to Admission medications   Medication Sig Start Date End Date Taking? Authorizing Provider  lisinopril (ZESTRIL) 5 MG tablet Take 1 tablet (5 mg total) by mouth daily. 09/01/21  Yes Argentina Donovan, PA-C  Blood Glucose Monitoring Suppl (TRUE METRIX METER) w/Device KIT Use to measure blood sugar twice a day 03/09/21   Elsie Stain, MD  Dulaglutide (TRULICITY) 1.5 TX/7.7SF SOPN Inject 1.5 mg into the skin once a week. 08/11/21   Elsie Stain, MD  gabapentin (NEURONTIN) 100 MG capsule Take 3 capsules (300 mg total) by mouth 2 (two) times daily. 08/11/21   Elsie Stain, MD  glucose blood (TRUE METRIX BLOOD GLUCOSE TEST) test strip Use as instructed 08/11/21   Elsie Stain, MD  metFORMIN (GLUCOPHAGE-XR) 500 MG 24 hr tablet Take 2 tablets (1,000 mg total) by mouth 2 (two) times daily with a meal. 08/11/21   Elsie Stain, MD  pantoprazole (PROTONIX) 40 MG tablet Take 1 tablet (40 mg total) by mouth daily. 08/11/21   Elsie Stain, MD  TRUEplus Lancets 28G MISC Use to measure blood sugar twice a day 05/11/21   Elsie Stain, MD    ROS: Neg HEENT Neg resp Neg cardiac Neg GI Neg GU Neg MS Neg  psych Neg neuro  Objective:   Vitals:   09/01/21 1538  BP: 138/87  Pulse: 87  Resp: 16  SpO2: 97%  Weight: 217 lb 12.8 oz (98.8 kg)   Exam General appearance : Awake, alert, not in any distress. Speech Clear. Not toxic looking HEENT: Atraumatic and NormocephalicChest: Good air entry bilaterally, CTAB.  No rales/rhonchi/wheezing CVS: S1 S2 regular, no murmurs.  Extremities: B/L Lower Ext shows no edema, both legs are warm to touch Neurology: Awake alert, and oriented X 3, CN II-XII intact, Non focal Skin: No Rash  Data Review Lab Results  Component Value Date   HGBA1C 7.4 (A) 09/01/2021   HGBA1C 6.8 05/11/2021   HGBA1C 12.1 (A) 12/02/2020    Assessment & Plan   1. Type 2 diabetes mellitus with hyperglycemia, without long-term current use of insulin (HCC) Continue trulicity and metformin-compliance imperative and follow diabetic diet.  - Glucose (CBG) - HgB A1c - Comprehensive metabolic panel - Lipid panel - CBC with Differential/Platelet  2. Neuropathy due to type 2 diabetes mellitus (HCC) Continue to fill gabapentin - CBC with Differential/Platelet  3. Elevated blood pressure reading Needs ace for kidney protection and BP goal <130/85 - lisinopril (ZESTRIL) 5 MG tablet; Take 1 tablet (5 mg total) by mouth daily.  Dispense: 90  tablet; Refill: 3  Compliance imperative  Patient have been counseled extensively about nutrition and exercise. Other issues discussed during this visit include: low cholesterol diet, weight control and daily exercise, foot care, annual eye examinations at Ophthalmology, importance of adherence with medications and regular follow-up. We also discussed long term complications of uncontrolled diabetes and hypertension.   Return in about 3 months (around 11/29/2021) for PCP for chronic conditions.  The patient was given clear instructions to go to ER or return to medical center if symptoms don't improve, worsen or new problems develop. The  patient verbalized understanding. The patient was told to call to get lab results if they haven't heard anything in the next week.      Tyrone Caldron, PA-C [facility] and [facility] [place], [place]   09/01/2021, 3:50 PM

## 2021-09-02 ENCOUNTER — Other Ambulatory Visit: Payer: Self-pay

## 2021-09-02 LAB — COMPREHENSIVE METABOLIC PANEL
ALT: 28 IU/L (ref 0–44)
AST: 17 IU/L (ref 0–40)
Albumin/Globulin Ratio: 1.4 (ref 1.2–2.2)
Albumin: 4.3 g/dL (ref 4.0–5.0)
Alkaline Phosphatase: 63 IU/L (ref 44–121)
BUN/Creatinine Ratio: 14 (ref 9–20)
BUN: 14 mg/dL (ref 6–20)
Bilirubin Total: 0.3 mg/dL (ref 0.0–1.2)
CO2: 24 mmol/L (ref 20–29)
Calcium: 10.1 mg/dL (ref 8.7–10.2)
Chloride: 104 mmol/L (ref 96–106)
Creatinine, Ser: 1 mg/dL (ref 0.76–1.27)
Globulin, Total: 3 g/dL (ref 1.5–4.5)
Glucose: 120 mg/dL — ABNORMAL HIGH (ref 70–99)
Potassium: 4.1 mmol/L (ref 3.5–5.2)
Sodium: 141 mmol/L (ref 134–144)
Total Protein: 7.3 g/dL (ref 6.0–8.5)
eGFR: 100 mL/min/{1.73_m2} (ref >59)

## 2021-09-02 LAB — CBC WITH DIFFERENTIAL/PLATELET
Basophils Absolute: 0 10*3/uL (ref 0.0–0.2)
Basos: 0 %
EOS (ABSOLUTE): 0.2 10*3/uL (ref 0.0–0.4)
Eos: 5 %
Hematocrit: 40.8 % (ref 37.5–51.0)
Hemoglobin: 13.5 g/dL (ref 13.0–17.7)
Immature Grans (Abs): 0 10*3/uL (ref 0.0–0.1)
Immature Granulocytes: 0 %
Lymphocytes Absolute: 2.2 10*3/uL (ref 0.7–3.1)
Lymphs: 46 %
MCH: 27.3 pg (ref 26.6–33.0)
MCHC: 33.1 g/dL (ref 31.5–35.7)
MCV: 83 fL (ref 79–97)
Monocytes Absolute: 0.4 10*3/uL (ref 0.1–0.9)
Monocytes: 9 %
Neutrophils Absolute: 1.9 10*3/uL (ref 1.4–7.0)
Neutrophils: 40 %
Platelets: 210 10*3/uL (ref 150–450)
RBC: 4.94 x10E6/uL (ref 4.14–5.80)
RDW: 14 % (ref 11.6–15.4)
WBC: 4.9 10*3/uL (ref 3.4–10.8)

## 2021-09-02 LAB — LIPID PANEL
Chol/HDL Ratio: 4.9 ratio (ref 0.0–5.0)
Cholesterol, Total: 170 mg/dL (ref 100–199)
HDL: 35 mg/dL — ABNORMAL LOW (ref >39)
LDL Chol Calc (NIH): 103 mg/dL — ABNORMAL HIGH (ref 0–99)
Triglycerides: 183 mg/dL — ABNORMAL HIGH (ref 0–149)
VLDL Cholesterol Cal: 32 mg/dL (ref 5–40)

## 2021-09-02 MED ORDER — LISINOPRIL 5 MG PO TABS
ORAL_TABLET | ORAL | 3 refills | Status: DC
  Filled 2021-09-02: qty 30, 30d supply, fill #0

## 2021-09-03 ENCOUNTER — Other Ambulatory Visit: Payer: Self-pay

## 2021-09-10 ENCOUNTER — Other Ambulatory Visit: Payer: Self-pay

## 2021-09-14 ENCOUNTER — Other Ambulatory Visit: Payer: Self-pay

## 2021-09-29 ENCOUNTER — Ambulatory Visit
Admission: EM | Admit: 2021-09-29 | Discharge: 2021-09-29 | Disposition: A | Discharge status: Home / Self Care | Payer: Self-pay | Attending: Internal Medicine | Admitting: Internal Medicine

## 2021-09-29 ENCOUNTER — Other Ambulatory Visit: Payer: Self-pay

## 2021-09-29 DIAGNOSIS — A084 Viral intestinal infection, unspecified: Secondary | ICD-10-CM

## 2021-09-29 DIAGNOSIS — R112 Nausea with vomiting, unspecified: Secondary | ICD-10-CM

## 2021-09-29 DIAGNOSIS — R197 Diarrhea, unspecified: Secondary | ICD-10-CM

## 2021-09-29 MED ORDER — ONDANSETRON 4 MG PO TBDP
4.0000 mg | ORAL_TABLET | Freq: Three times a day (TID) | ORAL | 0 refills | Status: DC | PRN
  Filled 2021-09-29: qty 20, 7d supply, fill #0

## 2021-09-29 NOTE — Discharge Instructions (Signed)
It appears that you have a viral stomach bug that should self resolve in the next few days with symptomatic treatment.  A nausea medication has been prescribed to help treat this.  Please increase clear oral fluid intake.  Monitor blood sugars as well.  Go to the hospital if symptoms persist or worsen. ?

## 2021-09-29 NOTE — ED Triage Notes (Signed)
Pt c/o vomiting, diarrhea, onset yesterday.  ?

## 2021-09-29 NOTE — ED Provider Notes (Signed)
?[facility] ? ? ? ?CSN: [id number] ?Arrival date & time: 09/29/21  1131 ? ? ?  ? ?History   ?Chief Complaint ?Chief Complaint  ?Patient presents with  ? Emesis  ? Diarrhea  ? ? ?HPI ?Tyrone Steele is a 36 y.o. male.  ? ?Patient presents with nausea, vomiting, diarrhea that started yesterday.  Denies any abdominal pain.  He reports that his daughter has similar symptoms currently.  Denies any fevers.  Patient denies any associated upper respiratory symptoms or sore throat.  Denies chest pain or shortness of breath.  He reports that he has not been able to keep any food or fluids down since yesterday.  Denies any blood in stool or emesis. ? ? ?Emesis ?Diarrhea ? ?Past Medical History:  ?Diagnosis Date  ? Anemia   ? Diabetes mellitus without complication ([place])   ? ? ?Patient Active Problem List  ? Diagnosis Date Noted  ? Neuropathy due to type 2 diabetes mellitus ([place]) 03/09/2021  ? Hyperlipidemia associated with type 2 diabetes mellitus ([place]) 12/02/2020  ? GERD without esophagitis 12/02/2020  ? Type 2 diabetes mellitus ([place]) 01/03/2019  ? ? ?Past Surgical History:  ?Procedure Laterality Date  ? NO PAST SURGERIES    ? ? ? ? ? ?Home Medications   ? ?Prior to Admission medications   ?Medication Sig Start Date End Date Taking? Authorizing Provider  ?ondansetron (ZOFRAN-ODT) 4 MG disintegrating tablet Take 1 tablet (4 mg total) by mouth every 8 (eight) hours as needed for nausea or vomiting. 09/29/21  Yes Teodora Medici, FNP  ?Blood Glucose Monitoring Suppl (TRUE METRIX METER) w/Device KIT Use to measure blood sugar twice a day 03/09/21   Elsie Stain, MD  ?Dulaglutide (TRULICITY) 1.5 QV/9.5GL SOPN Inject 1.5 mg into the skin once a week. 08/11/21   Elsie Stain, MD  ?gabapentin (NEURONTIN) 100 MG capsule Take 3 capsules (300 mg total) by mouth 2 (two) times daily. 08/11/21   Elsie Stain, MD  ?glucose blood (TRUE METRIX BLOOD GLUCOSE TEST) test strip Use as instructed 08/11/21   Elsie Stain,  MD  ?lisinopril (ZESTRIL) 5 MG tablet Take 1 tablet (5 mg total) by mouth daily. 09/01/21   Argentina Donovan, PA-C  ?lisinopril (ZESTRIL) 5 MG tablet take 1 tab by mouth daily 09/01/21     ?metFORMIN (GLUCOPHAGE-XR) 500 MG 24 hr tablet Take 2 tablets (1,000 mg total) by mouth 2 (two) times daily with a meal. 08/11/21   Elsie Stain, MD  ?pantoprazole (PROTONIX) 40 MG tablet Take 1 tablet (40 mg total) by mouth daily. 08/11/21   Elsie Stain, MD  ?TRUEplus Lancets 28G MISC Use to measure blood sugar twice a day 05/11/21   Elsie Stain, MD  ? ? ?Family History ?Family History  ?Problem Relation Age of Onset  ? Diabetes Mother   ? Hypertension Father   ? Diabetes Father   ? ? ?Social History ?Social History  ? ?Tobacco Use  ? Smoking status: Never  ? Smokeless tobacco: Never  ?Vaping Use  ? Vaping Use: Never used  ?Substance Use Topics  ? Alcohol use: No  ? Drug use: No  ? ? ? ?Allergies   ?Statins ? ? ?Review of Systems ?Review of Systems ?Per HPI ? ?Physical Exam ?Triage Vital Signs ?ED Triage Vitals [09/29/21 1211]  ?Enc Vitals Group  ?   BP 125/74  ?   Pulse Rate 71  ?   Resp 18  ?  Temp 98.2 ?F (36.8 ?C)  ?   Temp Source Oral  ?   SpO2 98 %  ?   Weight   ?   Height   ?   Head Circumference   ?   Peak Flow   ?   Pain Score 0  ?   Pain Loc   ?   Pain Edu?   ?   Excl. in [place]?   ? ?No data found. ? ?Updated Vital Signs ?BP 125/74 (BP Location: Right Arm)   Pulse 71   Temp 98.2 ?F (36.8 ?C) (Oral)   Resp 18   SpO2 98%  ? ?Visual Acuity ?Right Eye Distance:   ?Left Eye Distance:   ?Bilateral Distance:   ? ?Right Eye Near:   ?Left Eye Near:    ?Bilateral Near:    ? ?Physical Exam ?Constitutional:   ?   General: He is not in acute distress. ?   Appearance: Normal appearance. He is not toxic-appearing or diaphoretic.  ?HENT:  ?   Head: Normocephalic and atraumatic.  ?   Mouth/Throat:  ?   Mouth: Mucous membranes are moist.  ?   Pharynx: No posterior oropharyngeal erythema.  ?Eyes:  ?   Extraocular  Movements: Extraocular movements intact.  ?   Conjunctiva/sclera: Conjunctivae normal.  ?Cardiovascular:  ?   Rate and Rhythm: Normal rate and regular rhythm.  ?   Pulses: Normal pulses.  ?   Heart sounds: Normal heart sounds.  ?Pulmonary:  ?   Effort: Pulmonary effort is normal. No respiratory distress.  ?   Breath sounds: Normal breath sounds.  ?Abdominal:  ?   General: Bowel sounds are normal. There is no distension.  ?   Palpations: Abdomen is soft.  ?   Tenderness: There is no abdominal tenderness.  ?Neurological:  ?   General: No focal deficit present.  ?   Mental Status: He is alert and oriented to person, place, and time. Mental status is at baseline.  ?Psychiatric:     ?   Mood and Affect: Mood normal.     ?   Behavior: Behavior normal.     ?   Thought Content: Thought content normal.     ?   Judgment: Judgment normal.  ? ? ? ?UC Treatments / Results  ?Labs ?(all labs ordered are listed, but only abnormal results are displayed) ?Labs Reviewed - No data to display ? ?EKG ? ? ?Radiology ?No results found. ? ?Procedures ?Procedures (including critical care time) ? ?Medications Ordered in UC ?Medications - No data to display ? ?Initial Impression / Assessment and Plan / UC Course  ?I have reviewed the triage vital signs and the nursing notes. ? ?Pertinent labs & imaging results that were available during my care of the patient were reviewed by me and considered in my medical decision making (see chart for details). ? ?  ? ?Symptoms appear viral in etiology.  Ondansetron prescribed to take as needed for nausea.  Patient to increase clear oral fluid to prevent dehydration.  No current signs of dehydration at this time.  Discussed strict return and ER precautions.  Patient verbalized understanding and was agreeable with plan. ?Final Clinical Impressions(s) / UC Diagnoses  ? ?Final diagnoses:  ?Nausea vomiting and diarrhea  ?Viral gastroenteritis  ? ? ? ?Discharge Instructions   ? ?  ?It appears that you have a  viral stomach bug that should self resolve in the next few days with symptomatic treatment.  A  nausea medication has been prescribed to help treat this.  Please increase clear oral fluid intake.  Monitor blood sugars as well.  Go to the hospital if symptoms persist or worsen. ? ? ? ?ED Prescriptions   ? ? Medication Sig Dispense Auth. Provider  ? ondansetron (ZOFRAN-ODT) 4 MG disintegrating tablet Take 1 tablet (4 mg total) by mouth every 8 (eight) hours as needed for nausea or vomiting. 20 tablet Oswaldo Conroy E, [place]  ? ?  ? ?PDMP not reviewed this encounter. ?  ?Teodora Medici, [place] ?09/29/21 1241 ? ?

## 2021-10-06 ENCOUNTER — Other Ambulatory Visit: Payer: Self-pay

## 2021-10-11 ENCOUNTER — Other Ambulatory Visit: Payer: Self-pay

## 2021-10-13 ENCOUNTER — Other Ambulatory Visit: Payer: Self-pay

## 2021-11-01 ENCOUNTER — Other Ambulatory Visit: Payer: Self-pay

## 2021-11-01 ENCOUNTER — Ambulatory Visit (HOSPITAL_COMMUNITY)
Admission: EM | Admit: 2021-11-01 | Discharge: 2021-11-01 | Disposition: A | Discharge status: Home / Self Care | Payer: Self-pay | Attending: Sports Medicine | Admitting: Sports Medicine

## 2021-11-01 ENCOUNTER — Encounter (HOSPITAL_COMMUNITY): Payer: Self-pay | Admitting: Emergency Medicine

## 2021-11-01 DIAGNOSIS — J069 Acute upper respiratory infection, unspecified: Secondary | ICD-10-CM | POA: Insufficient documentation

## 2021-11-01 DIAGNOSIS — Z20822 Contact with and (suspected) exposure to covid-19: Secondary | ICD-10-CM | POA: Insufficient documentation

## 2021-11-01 DIAGNOSIS — R051 Acute cough: Secondary | ICD-10-CM | POA: Insufficient documentation

## 2021-11-01 MED ORDER — FLUTICASONE PROPIONATE 50 MCG/ACT NA SUSP
1.0000 | Freq: Every day | NASAL | 0 refills | Status: AC
  Filled 2021-11-01: qty 16, 30d supply, fill #0

## 2021-11-01 MED ORDER — GUAIFENESIN ER 600 MG PO TB12
1200.0000 mg | ORAL_TABLET | Freq: Two times a day (BID) | ORAL | 0 refills | Status: AC
  Filled 2021-11-01: qty 30, 8d supply, fill #0

## 2021-11-01 MED ORDER — FLUTICASONE PROPIONATE 50 MCG/ACT NA SUSP
1.0000 | Freq: Every day | NASAL | 0 refills | Status: DC
  Filled 2021-11-01: qty 1, 30d supply, fill #0

## 2021-11-01 NOTE — ED Notes (Signed)
Obtained covid swab, labeled at bedside and placed in lab 

## 2021-11-01 NOTE — ED Provider Notes (Signed)
?[facility] ? ? ? ?CSN: [id number] ?Arrival date & time: 11/01/21  0802 ? ? ?  ? ?History   ?Chief Complaint ?Chief Complaint  ?Patient presents with  ? Cough  ? ? ?HPI ?Tyrone Steele is a 36 y.o. male here for evaluation of nasal congestion and cough. ? ? ?Cough ?Associated symptoms: myalgias   ?Associated symptoms: no chest pain, no chills, no ear pain, no fever, no headaches, no rash, no shortness of breath, no sore throat and no wheezing   ? ?Symptoms started on Saturday  ?Cough with thicker mucus ?Nasal congestion ?Some body aches ?No fever/chills ?No chest pain or shortness of breath  ?No ear pain ?No sore throat or dysphagia ?Does have seasonal allergies --> sneezing last week around Easter, but this feels different ?Denies any sick contacts ?No nausea, vomiting or abdominal pain ? ?Tx: Tried Claritin - not much help ? ?Past Medical History:  ?Diagnosis Date  ? Anemia   ? Diabetes mellitus without complication ([place])   ? ? ?Patient Active Problem List  ? Diagnosis Date Noted  ? Neuropathy due to type 2 diabetes mellitus ([place]) 03/09/2021  ? Hyperlipidemia associated with type 2 diabetes mellitus ([place]) 12/02/2020  ? GERD without esophagitis 12/02/2020  ? Type 2 diabetes mellitus ([place]) 01/03/2019  ? ? ?Past Surgical History:  ?Procedure Laterality Date  ? NO PAST SURGERIES    ? ? ? ? ? ?Home Medications   ? ?Prior to Admission medications   ?Medication Sig Start Date End Date Taking? Authorizing Provider  ?guaiFENesin (MUCINEX) 600 MG 12 hr tablet Take 2 tablets (1,200 mg total) by mouth 2 (two) times daily for 10 days. 11/01/21 11/11/21 Yes Elba Barman, DO  ?Blood Glucose Monitoring Suppl (TRUE METRIX METER) w/Device KIT Use to measure blood sugar twice a day 03/09/21   Elsie Stain, MD  ?Dulaglutide (TRULICITY) 1.5 TX/6.4WO SOPN Inject 1.5 mg into the skin once a week. 08/11/21   Elsie Stain, MD  ?fluticasone (FLONASE) 50 MCG/ACT nasal spray Place 1 spray into both nostrils daily. 11/01/21  12/01/21  Elba Barman, DO  ?gabapentin (NEURONTIN) 100 MG capsule Take 3 capsules (300 mg total) by mouth 2 (two) times daily. 08/11/21   Elsie Stain, MD  ?glucose blood (TRUE METRIX BLOOD GLUCOSE TEST) test strip Use as instructed 08/11/21   Elsie Stain, MD  ?lisinopril (ZESTRIL) 5 MG tablet Take 1 tablet (5 mg total) by mouth daily. 09/01/21   Argentina Donovan, PA-C  ?lisinopril (ZESTRIL) 5 MG tablet take 1 tab by mouth daily 09/01/21     ?metFORMIN (GLUCOPHAGE-XR) 500 MG 24 hr tablet Take 2 tablets (1,000 mg total) by mouth 2 (two) times daily with a meal. 08/11/21   Elsie Stain, MD  ?ondansetron (ZOFRAN-ODT) 4 MG disintegrating tablet Take 1 tablet (4 mg total) by mouth every 8 (eight) hours as needed for nausea or vomiting. 09/29/21   Teodora Medici, FNP  ?pantoprazole (PROTONIX) 40 MG tablet Take 1 tablet (40 mg total) by mouth daily. 08/11/21   Elsie Stain, MD  ?TRUEplus Lancets 28G MISC Use to measure blood sugar twice a day 05/11/21   Elsie Stain, MD  ? ? ?Family History ?Family History  ?Problem Relation Age of Onset  ? Diabetes Mother   ? Hypertension Father   ? Diabetes Father   ? ? ?Social History ?Social History  ? ?Tobacco Use  ? Smoking status: Never  ? Smokeless tobacco: Never  ?Vaping  Use  ? Vaping Use: Never used  ?Substance Use Topics  ? Alcohol use: No  ? Drug use: No  ? ? ? ?Allergies   ?Statins ? ? ?Review of Systems ?Review of Systems  ?Constitutional:  Negative for chills, fatigue and fever.  ?HENT:  Positive for congestion and sinus pressure. Negative for ear pain and sore throat.   ?Respiratory:  Positive for cough. Negative for shortness of breath and wheezing.   ?Cardiovascular:  Negative for chest pain and palpitations.  ?Gastrointestinal:  Negative for abdominal pain.  ?Musculoskeletal:  Positive for myalgias.  ?Skin:  Negative for rash.  ?Neurological:  Negative for dizziness and headaches.  ? ? ?Physical Exam ?Triage Vital Signs ?ED Triage Vitals  ?Enc Vitals  Group  ?   BP 11/01/21 0825 128/84  ?   Pulse Rate 11/01/21 0825 68  ?   Resp 11/01/21 0825 20  ?   Temp 11/01/21 0825 98 ?F (36.7 ?C)  ?   Temp Source 11/01/21 0825 Oral  ?   SpO2 11/01/21 0825 98 %  ?   Weight --   ?   Height --   ?   Head Circumference --   ?   Peak Flow --   ?   Pain Score 11/01/21 0822 7  ?   Pain Loc --   ?   Pain Edu? --   ?   Excl. in [place]? --   ? ?No data found. ? ?Updated Vital Signs ?BP 128/84 (BP Location: Right Arm) Comment (BP Location): large cuff  Pulse 68   Temp 98 ?F (36.7 ?C) (Oral)   Resp 20   SpO2 98%  ? ?Physical Exam ?Constitutional:   ?   General: He is not in acute distress. ?   Appearance: Normal appearance. He is not toxic-appearing.  ?HENT:  ?   Head: Normocephalic and atraumatic.  ?   Right Ear: Tympanic membrane and ear canal normal.  ?   Left Ear: Tympanic membrane and ear canal normal.  ?   Nose: Congestion (mild clear-green congestion) present.  ?   Mouth/Throat:  ?   Mouth: Mucous membranes are moist.  ?   Pharynx: Oropharynx is clear. No oropharyngeal exudate or posterior oropharyngeal erythema.  ?   Comments: + Posterior oropharynx cobblestoning ?Eyes:  ?   Extraocular Movements: Extraocular movements intact.  ?   Conjunctiva/sclera: Conjunctivae normal.  ?   Pupils: Pupils are equal, round, and reactive to light.  ?Cardiovascular:  ?   Rate and Rhythm: Normal rate.  ?   Pulses: Normal pulses.  ?Pulmonary:  ?   Effort: Pulmonary effort is normal. No respiratory distress.  ?   Breath sounds: No wheezing, rhonchi or rales.  ?Abdominal:  ?   General: Abdomen is flat.  ?   Palpations: Abdomen is soft.  ?Musculoskeletal:  ?   Cervical back: Neck supple.  ?Lymphadenopathy:  ?   Cervical: No cervical adenopathy.  ?Skin: ?   Capillary Refill: Capillary refill takes less than 2 seconds.  ?Neurological:  ?   General: No focal deficit present.  ?   Mental Status: He is alert.  ?Psychiatric:     ?   Mood and Affect: Mood normal.     ?   Thought Content: Thought content  normal.  ? ? ? ?UC Treatments / Results  ?Labs ?(all labs ordered are listed, but only abnormal results are displayed) ?Labs Reviewed  ?SARS CORONAVIRUS 2 (TAT 6-24 HRS)  ? ? ?  EKG ? ? ?Radiology ?No results found. ? ?Procedures ?Procedures (including critical care time) ? ?Medications Ordered in UC ?Medications - No data to display ? ?Initial Impression / Assessment and Plan / UC Course  ?I have reviewed the triage vital signs and the nursing notes. ? ?Pertinent labs & imaging results that were available during my care of the patient were reviewed by me and considered in my medical decision making (see chart for details). ? ?  ? ?Patient presents with 3 days of viral URI.  He was endorsing some seasonal allergy component over a week ago as well, but taking Claritin over-the-counter for this.  He does have some nasal congestion and signs of postnasal drip.  Discussed this seems viral in nature and typical 7-10-day course until improvement.  He is afebrile with vital signs within normal limits today.  We will treat with Mucinex 2 tablets twice a day to help with thinning of nasal congestion, Flonase 1 puff into each nostril daily.  Discussed supportive treatments for viral URI, patient is agreeable and understanding.  Patient would desire testing for COVID-19, that was performed today.  We will call him with these results once returned.  As long as they are negative, he may return to work tomorrow if he is absent of fever of greater than 100.4 degrees.  Follow-up with PCP as needed. ?Final Clinical Impressions(s) / UC Diagnoses  ? ?Final diagnoses:  ?Acute cough  ?Viral URI with cough  ? ? ? ?Discharge Instructions   ? ?  ?- Take Mucinex, 2 tablets twice a day to help with nasal congestion and thick-mucus cough ?-Take 1 spray of Flonase into each nostril once a day for the next month ? ?-Other things you can try for your cough are: Warm tea, honey, throat lozenge or cough drops ? ?-This typically takes about 7-10 days  to improve, if your symptoms worsen or you start getting new fevers you may follow-up with your PCP or here ? ? ? ? ?ED Prescriptions   ? ? Medication Sig Dispense Auth. Provider  ? fluticasone (FLONASE) 38 M

## 2021-11-01 NOTE — Discharge Instructions (Addendum)
-   Take Mucinex, 2 tablets twice a day to help with nasal congestion and thick-mucus cough ?-Take 1 spray of Flonase into each nostril once a day for the next month ? ?-Other things you can try for your cough are: Warm tea, honey, throat lozenge or cough drops ? ?-This typically takes about 7-10 days to improve, if your symptoms worsen or you start getting new fevers you may follow-up with your PCP or here ?

## 2021-11-01 NOTE — ED Triage Notes (Signed)
Cough, congestion, body aches started Friday.  Denies fever.  Denies ear or throat pain ?

## 2021-11-02 LAB — SARS CORONAVIRUS 2 (TAT 6-24 HRS): SARS Coronavirus 2: NEGATIVE

## 2021-12-01 ENCOUNTER — Ambulatory Visit: Payer: Self-pay | Admitting: Critical Care Medicine

## 2021-12-01 NOTE — Progress Notes (Deleted)
Established Patient Office Visit  Subjective:  Patient ID: Tyrone Steele, male    DOB: 1986/04/03  Age: 36 y.o. MRN: [id number]     CC: Primary care follow-up  HPI 07/2021 TODD JELINSKI presents for primary care follow-up this is a telephone visit he does not have technology to achieve video.  This patient is seen for type 2 diabetes and follow-up from an October 2022 face-to-face visit.  He has been doing relatively well his blood sugar runs around 150 on average and he has no real active complaints.  His biggest issue is he has a new job and he has to be on the job from 8 to 4 PM and they are very difficult with him if he takes time off for medical visits.  He needed a letter for me today indicating the need to come in for laboratory and for direct exams.  He has a follow-up visit with our PA Mcclung February 15 there  is actually a visit in April with me as well face-to-face  He has no other complaints  5/17  EYE  UC 3/15 viral GI  4/17 acute cough   A1C 7.4 in Feb Saw McClung 08/2021 1. Type 2 diabetes mellitus with hyperglycemia, without long-term current use of insulin (HCC) Continue trulicity and metformin-compliance imperative and follow diabetic diet.  - Glucose (CBG) - HgB A1c - Comprehensive metabolic panel - Lipid panel - CBC with Differential/Platelet   2. Neuropathy due to type 2 diabetes mellitus (HCC) Continue to fill gabapentin - CBC with Differential/Platelet   3. Elevated blood pressure reading Needs ace for kidney protection and BP goal <130/85 - lisinopril (ZESTRIL) 5 MG tablet; Take 1 tablet (5 mg total) by mouth daily.  Dispense: 90 tablet; Refill: 3    Type 2 diabetes mellitus (HCC)    Type 2 diabetes appears to be relatively well controlled on Trulicity and metformin we will not make changes provide refills  Patient to come in for a direct exam February 15 with the PA Mcclung  Patient's encouraged to keep an eye exam appointment      Relevant  Medications   Dulaglutide (TRULICITY) 1.5 MG/0.5ML SOPN   metFORMIN (GLUCOPHAGE-XR) 500 MG 24 hr tablet    Past Medical History:  Diagnosis Date   Anemia    Diabetes mellitus without complication (HCC)     Past Surgical History:  Procedure Laterality Date   NO PAST SURGERIES      Family History  Problem Relation Age of Onset   Diabetes Mother    Hypertension Father    Diabetes Father     Social History   Socioeconomic History   Marital status: Single    Spouse name: Not on file   Number of children: Not on file   Years of education: Not on file   Highest education level: Not on file  Occupational History   Not on file  Tobacco Use   Smoking status: Never   Smokeless tobacco: Never  Vaping Use   Vaping Use: Never used  Substance and Sexual Activity   Alcohol use: No   Drug use: No   Sexual activity: Yes  Other Topics Concern   Not on file  Social History Narrative   Not on file   Social Determinants of Health   Financial Resource Strain: Not on file  Food Insecurity: Not on file  Transportation Needs: Not on file  Physical Activity: Not on file  Stress: Not on file  Social  Connections: Not on file  Intimate Partner Violence: Not on file    Outpatient Medications Prior to Visit  Medication Sig Dispense Refill   Blood Glucose Monitoring Suppl (TRUE METRIX METER) w/Device KIT Use to measure blood sugar twice a day 1 kit 0   Dulaglutide (TRULICITY) 1.5 YY/5.0PT SOPN Inject 1.5 mg into the skin once a week. 2 mL 4   fluticasone (FLONASE) 50 MCG/ACT nasal spray Place 1 spray into both nostrils daily. 16 g 0   gabapentin (NEURONTIN) 100 MG capsule Take 3 capsules (300 mg total) by mouth 2 (two) times daily. 60 capsule 1   glucose blood (TRUE METRIX BLOOD GLUCOSE TEST) test strip Use as instructed 100 each 12   lisinopril (ZESTRIL) 5 MG tablet Take 1 tablet (5 mg total) by mouth daily. 90 tablet 3   lisinopril (ZESTRIL) 5 MG tablet take 1 tab by mouth daily 90  tablet 3   metFORMIN (GLUCOPHAGE-XR) 500 MG 24 hr tablet Take 2 tablets (1,000 mg total) by mouth 2 (two) times daily with a meal. 360 tablet 3   ondansetron (ZOFRAN-ODT) 4 MG disintegrating tablet Take 1 tablet (4 mg total) by mouth every 8 (eight) hours as needed for nausea or vomiting. 20 tablet 0   pantoprazole (PROTONIX) 40 MG tablet Take 1 tablet (40 mg total) by mouth daily. 30 tablet 3   TRUEplus Lancets 28G MISC Use to measure blood sugar twice a day 100 each 1   No facility-administered medications prior to visit.    Allergies  Allergen Reactions   Statins Nausea Only and Other (See Comments)    Severe fatigue    ROS Review of Systems  Constitutional: Negative.   HENT: Negative.  Negative for ear pain, postnasal drip, rhinorrhea, sinus pressure, sore throat, trouble swallowing and voice change.   Eyes: Negative.   Respiratory: Negative.  Negative for apnea, cough, choking, chest tightness, shortness of breath, wheezing and stridor.   Cardiovascular: Negative.  Negative for chest pain, palpitations and leg swelling.  Gastrointestinal:  Positive for abdominal pain. Negative for abdominal distention, nausea and vomiting.  Genitourinary: Negative.   Musculoskeletal: Negative.  Negative for arthralgias and myalgias.  Skin: Negative.  Negative for rash.  Allergic/Immunologic: Negative.  Negative for environmental allergies and food allergies.  Neurological: Negative.  Negative for dizziness, syncope, weakness and headaches.  Hematological: Negative.  Negative for adenopathy. Does not bruise/bleed easily.  Psychiatric/Behavioral: Negative.  Negative for agitation and sleep disturbance. The patient is not nervous/anxious.      Objective:    Physical Exam No exam this is a phone visit There were no vitals taken for this visit. Wt Readings from Last 3 Encounters:  09/01/21 217 lb 12.8 oz (98.8 kg)  05/11/21 211 lb 9.6 oz (96 kg)  01/26/21 204 lb (92.5 kg)     Health  Maintenance Due  Topic Date Due   OPHTHALMOLOGY EXAM  Never done    There are no preventive care reminders to display for this patient.  No results found for: TSH Lab Results  Component Value Date   WBC 4.9 09/01/2021   HGB 13.5 09/01/2021   HCT 40.8 09/01/2021   MCV 83 09/01/2021   PLT 210 09/01/2021   Lab Results  Component Value Date   NA 141 09/01/2021   K 4.1 09/01/2021   CO2 24 09/01/2021   GLUCOSE 120 (H) 09/01/2021   BUN 14 09/01/2021   CREATININE 1.00 09/01/2021   BILITOT 0.3 09/01/2021   ALKPHOS 63 09/01/2021  AST 17 09/01/2021   ALT 28 09/01/2021   PROT 7.3 09/01/2021   ALBUMIN 4.3 09/01/2021   CALCIUM 10.1 09/01/2021   ANIONGAP 12 08/09/2020   EGFR 100 09/01/2021   Lab Results  Component Value Date   CHOL 170 09/01/2021   Lab Results  Component Value Date   HDL 35 (L) 09/01/2021   Lab Results  Component Value Date   LDLCALC 103 (H) 09/01/2021   Lab Results  Component Value Date   TRIG 183 (H) 09/01/2021   Lab Results  Component Value Date   CHOLHDL 4.9 09/01/2021   Lab Results  Component Value Date   HGBA1C 7.4 (A) 09/01/2021      Assessment & Plan:   Problem List Items Addressed This Visit   None  No orders of the defined types were placed in this encounter.  Follow Up Instructions: Patient knows a direct exam will occur on February 15 in the office face-to-face with PA Mcclung he is to keep this appointment I will see him later after he is seen by the PA as he already has this established appointment he knows medication refills will be sent for him on all medicines   I discussed the assessment and treatment plan with the patient. The patient was provided an opportunity to ask questions and all were answered. The patient agreed with the plan and demonstrated an understanding of the instructions.   The patient was advised to call back or seek an in-person evaluation if the symptoms worsen or if the condition fails to improve as  anticipated.  I provided 29 minutes of non-face-to-face time during this encounter  including  median intraservice time , review of notes, labs, imaging, medications  and explaining diagnosis and management to the patient .    Asencion Noble, MD

## 2021-12-09 ENCOUNTER — Other Ambulatory Visit: Payer: Self-pay

## 2021-12-09 ENCOUNTER — Ambulatory Visit (HOSPITAL_COMMUNITY)
Admission: EM | Admit: 2021-12-09 | Discharge: 2021-12-09 | Disposition: A | Discharge status: Home / Self Care | Payer: Self-pay | Attending: Internal Medicine | Admitting: Internal Medicine

## 2021-12-09 ENCOUNTER — Encounter (HOSPITAL_COMMUNITY): Payer: Self-pay | Admitting: Emergency Medicine

## 2021-12-09 DIAGNOSIS — K0889 Other specified disorders of teeth and supporting structures: Secondary | ICD-10-CM

## 2021-12-09 DIAGNOSIS — K047 Periapical abscess without sinus: Secondary | ICD-10-CM

## 2021-12-09 MED ORDER — AMOXICILLIN-POT CLAVULANATE 875-125 MG PO TABS
1.0000 | ORAL_TABLET | Freq: Two times a day (BID) | ORAL | 0 refills | Status: DC
  Filled 2021-12-09: qty 14, 7d supply, fill #0

## 2021-12-09 NOTE — ED Provider Notes (Signed)
[facility]    CSN: [id number] Arrival date & time: 12/09/21  0805      History   Chief Complaint Chief Complaint  Patient presents with   Dental Pain    HPI Tyrone Steele is a 36 y.o. male.   Patient presents with left sided lower dental pain that has been present since yesterday.  Denies any obvious injury to the area.  Denies fever, bodies, chills, purulent drainage.  Patient does report history of dental problems.  Patient has not contacted dentist yet.  Patient has not taken any medications for pain.   Dental Pain  Past Medical History:  Diagnosis Date   Anemia    Diabetes mellitus without complication [facility])     Patient Active Problem List   Diagnosis Date Noted   Neuropathy due to type 2 diabetes mellitus ([place]) 03/09/2021   Hyperlipidemia associated with type 2 diabetes mellitus ([place]) 12/02/2020   GERD without esophagitis 12/02/2020   Type 2 diabetes mellitus ([place]) 01/03/2019    Past Surgical History:  Procedure Laterality Date   NO PAST SURGERIES         Home Medications    Prior to Admission medications   Medication Sig Start Date End Date Taking? Authorizing Provider  amoxicillin-clavulanate (AUGMENTIN) 875-125 MG tablet Take 1 tablet by mouth every 12 (twelve) hours. 12/09/21  Yes Zelia Yzaguirre, [place], FNP  Blood Glucose Monitoring Suppl (TRUE METRIX METER) w/Device KIT Use to measure blood sugar twice a day 03/09/21   Elsie Stain, MD  Dulaglutide (TRULICITY) 1.5 VG/6.8DP SOPN Inject 1.5 mg into the skin once a week. 08/11/21   Elsie Stain, MD  fluticasone (FLONASE) 50 MCG/ACT nasal spray Place 1 spray into both nostrils daily. 11/01/21 12/01/21  Elba Barman, DO  gabapentin (NEURONTIN) 100 MG capsule Take 3 capsules (300 mg total) by mouth 2 (two) times daily. 08/11/21   Elsie Stain, MD  glucose blood (TRUE METRIX BLOOD GLUCOSE TEST) test strip Use as instructed 08/11/21   Elsie Stain, MD  lisinopril (ZESTRIL) 5 MG tablet  Take 1 tablet (5 mg total) by mouth daily. 09/01/21   Argentina Donovan, PA-C  lisinopril (ZESTRIL) 5 MG tablet take 1 tab by mouth daily 09/01/21     metFORMIN (GLUCOPHAGE-XR) 500 MG 24 hr tablet Take 2 tablets (1,000 mg total) by mouth 2 (two) times daily with a meal. 08/11/21   Elsie Stain, MD  ondansetron (ZOFRAN-ODT) 4 MG disintegrating tablet Take 1 tablet (4 mg total) by mouth every 8 (eight) hours as needed for nausea or vomiting. 09/29/21   Teodora Medici, FNP  pantoprazole (PROTONIX) 40 MG tablet Take 1 tablet (40 mg total) by mouth daily. 08/11/21   Elsie Stain, MD  TRUEplus Lancets 28G MISC Use to measure blood sugar twice a day 05/11/21   Elsie Stain, MD    Family History Family History  Problem Relation Age of Onset   Diabetes Mother    Hypertension Father    Diabetes Father     Social History Social History   Tobacco Use   Smoking status: Never   Smokeless tobacco: Never  Vaping Use   Vaping Use: Never used  Substance Use Topics   Alcohol use: No   Drug use: No     Allergies   Statins   Review of Systems Review of Systems Per HPI  Physical Exam Triage Vital Signs ED Triage Vitals  Enc Vitals Group  BP 12/09/21 0824 (!) 145/86     Pulse Rate 12/09/21 0824 69     Resp 12/09/21 0824 18     Temp 12/09/21 0825 98 F (36.7 C)     Temp src --      SpO2 12/09/21 0824 100 %     Weight --      Height --      Head Circumference --      Peak Flow --      Pain Score 12/09/21 0824 10     Pain Loc --      Pain Edu? --      Excl. in [place]? --    No data found.  Updated Vital Signs BP (!) 145/86   Pulse 69   Temp 98 F (36.7 C)   Resp 18   SpO2 100%   Visual Acuity Right Eye Distance:   Left Eye Distance:   Bilateral Distance:    Right Eye Near:   Left Eye Near:    Bilateral Near:     Physical Exam Constitutional:      General: He is not in acute distress.    Appearance: Normal appearance. He is not toxic-appearing or  diaphoretic.  HENT:     Head: Normocephalic and atraumatic.     Mouth/Throat:     Dentition: Abnormal dentition. Dental tenderness and gingival swelling present.     Comments: Gingival erythema and swelling to left lower dentition and partially to left upper dentition.  No obvious abscess. Eyes:     Extraocular Movements: Extraocular movements intact.     Conjunctiva/sclera: Conjunctivae normal.  Pulmonary:     Effort: Pulmonary effort is normal.  Neurological:     General: No focal deficit present.     Mental Status: He is alert and oriented to person, place, and time. Mental status is at baseline.  Psychiatric:        Mood and Affect: Mood normal.        Behavior: Behavior normal.        Thought Content: Thought content normal.        Judgment: Judgment normal.     UC Treatments / Results  Labs (all labs ordered are listed, but only abnormal results are displayed) Labs Reviewed - No data to display  EKG   Radiology No results found.  Procedures Procedures (including critical care time)  Medications Ordered in UC Medications - No data to display  Initial Impression / Assessment and Plan / UC Course  I have reviewed the triage vital signs and the nursing notes.  Pertinent labs & imaging results that were available during my care of the patient were reviewed by me and considered in my medical decision making (see chart for details).     It appears the patient has dental infection.  Will treat with Augmentin antibiotic.  Discussed supportive care and symptom management with patient.  Patient advised to follow-up with dentist for further evaluation and management.  Patient verbalized understanding and was agreeable with plan. Final Clinical Impressions(s) / UC Diagnoses   Final diagnoses:  Pain, dental  Dental infection     Discharge Instructions      You have a dental infection which is being treated with Augmentin antibiotic.  Please follow-up with dentist  soon as possible.    ED Prescriptions     Medication Sig Dispense Auth. Provider   amoxicillin-clavulanate (AUGMENTIN) 875-125 MG tablet Take 1 tablet by mouth every 12 (twelve) hours. 14 tablet [place],  Michele Rockers, FNP      PDMP not reviewed this encounter.   Teodora Medici, [place] 12/09/21 [phone]

## 2021-12-09 NOTE — Discharge Instructions (Signed)
You have a dental infection which is being treated with Augmentin antibiotic.  Please follow-up with dentist soon as possible.

## 2021-12-09 NOTE — ED Triage Notes (Signed)
Pt is present today with left side dental pain. Pt states the pain started yesterday

## 2021-12-21 ENCOUNTER — Encounter (HOSPITAL_COMMUNITY): Payer: Self-pay

## 2021-12-21 ENCOUNTER — Emergency Department (HOSPITAL_COMMUNITY): Payer: No Typology Code available for payment source

## 2021-12-21 ENCOUNTER — Emergency Department (HOSPITAL_COMMUNITY)
Admission: EM | Admit: 2021-12-21 | Discharge: 2021-12-21 | Disposition: A | Discharge status: Home / Self Care | Payer: No Typology Code available for payment source | Attending: Emergency Medicine | Admitting: Emergency Medicine

## 2021-12-21 ENCOUNTER — Other Ambulatory Visit: Payer: Self-pay

## 2021-12-21 DIAGNOSIS — M79601 Pain in right arm: Secondary | ICD-10-CM | POA: Insufficient documentation

## 2021-12-21 DIAGNOSIS — G44319 Acute post-traumatic headache, not intractable: Secondary | ICD-10-CM | POA: Diagnosis not present

## 2021-12-21 DIAGNOSIS — M79602 Pain in left arm: Secondary | ICD-10-CM | POA: Insufficient documentation

## 2021-12-21 DIAGNOSIS — M62838 Other muscle spasm: Secondary | ICD-10-CM | POA: Diagnosis not present

## 2021-12-21 DIAGNOSIS — Z794 Long term (current) use of insulin: Secondary | ICD-10-CM | POA: Diagnosis not present

## 2021-12-21 DIAGNOSIS — Y92410 Unspecified street and highway as the place of occurrence of the external cause: Secondary | ICD-10-CM | POA: Diagnosis not present

## 2021-12-21 DIAGNOSIS — R519 Headache, unspecified: Secondary | ICD-10-CM | POA: Diagnosis present

## 2021-12-21 MED ORDER — NAPROXEN 500 MG PO TABS
500.0000 mg | ORAL_TABLET | Freq: Two times a day (BID) | ORAL | 0 refills | Status: DC
  Filled 2021-12-21: qty 30, 15d supply, fill #0

## 2021-12-21 MED ORDER — LIDOCAINE 5 % EX PTCH
1.0000 | MEDICATED_PATCH | CUTANEOUS | 0 refills | Status: DC
Start: 2021-12-21 — End: 2023-04-21
  Filled 2021-12-21: qty 30, 30d supply, fill #0

## 2021-12-21 MED ORDER — METHOCARBAMOL 500 MG PO TABS
500.0000 mg | ORAL_TABLET | Freq: Two times a day (BID) | ORAL | 0 refills | Status: DC
  Filled 2021-12-21: qty 20, 10d supply, fill #0
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
## 2021-12-21 NOTE — Discharge Instructions (Signed)
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
## 2021-12-26 ENCOUNTER — Encounter (HOSPITAL_COMMUNITY): Payer: Self-pay | Admitting: Emergency Medicine

## 2021-12-26 ENCOUNTER — Emergency Department (HOSPITAL_COMMUNITY): Payer: No Typology Code available for payment source

## 2021-12-26 ENCOUNTER — Emergency Department (HOSPITAL_COMMUNITY)
Admission: EM | Admit: 2021-12-26 | Discharge: 2021-12-26 | Disposition: A | Discharge status: Home / Self Care | Payer: No Typology Code available for payment source | Attending: Emergency Medicine | Admitting: Emergency Medicine

## 2021-12-26 ENCOUNTER — Other Ambulatory Visit: Payer: Self-pay

## 2021-12-26 DIAGNOSIS — Y92410 Unspecified street and highway as the place of occurrence of the external cause: Secondary | ICD-10-CM | POA: Diagnosis not present

## 2021-12-26 DIAGNOSIS — W2211XD Striking against or struck by driver side automobile airbag, subsequent encounter: Secondary | ICD-10-CM | POA: Insufficient documentation

## 2021-12-26 DIAGNOSIS — Z7985 Long-term (current) use of injectable non-insulin antidiabetic drugs: Secondary | ICD-10-CM | POA: Diagnosis not present

## 2021-12-26 DIAGNOSIS — R519 Headache, unspecified: Secondary | ICD-10-CM | POA: Diagnosis not present

## 2021-12-26 DIAGNOSIS — R0789 Other chest pain: Secondary | ICD-10-CM | POA: Diagnosis present

## 2021-12-26 DIAGNOSIS — Z7984 Long term (current) use of oral hypoglycemic drugs: Secondary | ICD-10-CM | POA: Insufficient documentation

## 2021-12-26 DIAGNOSIS — H9313 Tinnitus, bilateral: Secondary | ICD-10-CM | POA: Diagnosis not present

## 2021-12-26 DIAGNOSIS — Y999 Unspecified external cause status: Secondary | ICD-10-CM | POA: Insufficient documentation

## 2021-12-26 DIAGNOSIS — E119 Type 2 diabetes mellitus without complications: Secondary | ICD-10-CM | POA: Insufficient documentation

## 2021-12-26 DIAGNOSIS — Y9389 Activity, other specified: Secondary | ICD-10-CM | POA: Diagnosis not present
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
## 2022-01-27 ENCOUNTER — Inpatient Hospital Stay: Payer: Self-pay | Admitting: Physician Assistant

## 2022-01-27 DIAGNOSIS — E1165 Type 2 diabetes mellitus with hyperglycemia: Secondary | ICD-10-CM

## 2022-04-26 ENCOUNTER — Ambulatory Visit
Admission: EM | Admit: 2022-04-26 | Discharge: 2022-04-26 | Disposition: A | Discharge status: Home / Self Care | Payer: Self-pay | Attending: Physician Assistant | Admitting: Physician Assistant

## 2022-04-26 ENCOUNTER — Other Ambulatory Visit: Payer: Self-pay

## 2022-04-26 DIAGNOSIS — K047 Periapical abscess without sinus: Secondary | ICD-10-CM

## 2022-04-26 MED ORDER — AMOXICILLIN 500 MG PO CAPS
500.0000 mg | ORAL_CAPSULE | Freq: Three times a day (TID) | ORAL | 0 refills | Status: DC
  Filled 2022-04-26: qty 21, 7d supply, fill #0

## 2022-04-26 MED ORDER — KETOROLAC TROMETHAMINE 60 MG/2ML IM SOLN
60.0000 mg | Freq: Once | INTRAMUSCULAR | Status: AC
  Administered 2022-04-26: 60 mg via INTRAMUSCULAR

## 2022-04-26 MED ORDER — IBUPROFEN 600 MG PO TABS
600.0000 mg | ORAL_TABLET | Freq: Four times a day (QID) | ORAL | 0 refills | Status: DC | PRN
  Filled 2022-04-26: qty 30, 8d supply, fill #0

## 2022-04-26 NOTE — Discharge Instructions (Addendum)
Start antibiotic today- start ibuprofen tomorrow.   Follow up with dentist as soon as you are able.   Follow up in ED with any worsening symptoms.

## 2022-04-26 NOTE — ED Provider Notes (Signed)
[facility]    CSN: [id number] Arrival date & time: 04/26/22  1123      History   Chief Complaint Chief Complaint  Patient presents with   Dental Pain    HPI Tyrone Steele is a 36 y.o. male.   Patient here today for evaluation of left sided dental pain that started 2 days ago. He reports pain is significant. He has not had fever. He has tried [employer] without significant relief.   The history is provided by the patient.  Dental Pain Associated symptoms: no fever     Past Medical History:  Diagnosis Date   Anemia    Diabetes mellitus without complication [facility])     Patient Active Problem List   Diagnosis Date Noted   Neuropathy due to type 2 diabetes mellitus ([place]) 03/09/2021   Hyperlipidemia associated with type 2 diabetes mellitus ([place]) 12/02/2020   GERD without esophagitis 12/02/2020   Type 2 diabetes mellitus ([place]) 01/03/2019    Past Surgical History:  Procedure Laterality Date   NO PAST SURGERIES         Home Medications    Prior to Admission medications   Medication Sig Start Date End Date Taking? Authorizing Provider  amoxicillin (AMOXIL) 500 MG capsule Take 1 capsule (500 mg total) by mouth 3 (three) times daily. 04/26/22  Yes Francene Finders, PA-C  ibuprofen (ADVIL) 600 MG tablet Take 1 tablet (600 mg total) by mouth every 6 (six) hours as needed. 04/26/22  Yes Francene Finders, PA-C  Blood Glucose Monitoring Suppl (TRUE METRIX METER) w/Device KIT Use to measure blood sugar twice a day 03/09/21   Elsie Stain, MD  Dulaglutide (TRULICITY) 1.5 AJ/2.8NO SOPN Inject 1.5 mg into the skin once a week. 08/11/21   Elsie Stain, MD  fluticasone (FLONASE) 50 MCG/ACT nasal spray Place 1 spray into both nostrils daily. 11/01/21 12/01/21  Elba Barman, DO  gabapentin (NEURONTIN) 100 MG capsule Take 3 capsules (300 mg total) by mouth 2 (two) times daily. 08/11/21   Elsie Stain, MD  glucose blood (TRUE METRIX BLOOD GLUCOSE TEST) test  strip Use as instructed 08/11/21   Elsie Stain, MD  lidocaine (LIDODERM) 5 % Place 1 patch onto the skin daily. Remove & Discard patch within 12 hours or as directed by MD 12/21/21   Henderly, Britni A, PA-C  lisinopril (ZESTRIL) 5 MG tablet Take 1 tablet (5 mg total) by mouth daily. 09/01/21   Argentina Donovan, PA-C  lisinopril (ZESTRIL) 5 MG tablet take 1 tab by mouth daily 09/01/21     metFORMIN (GLUCOPHAGE-XR) 500 MG 24 hr tablet Take 2 tablets (1,000 mg total) by mouth 2 (two) times daily with a meal. 08/11/21   Elsie Stain, MD  methocarbamol (ROBAXIN) 500 MG tablet Take 1 tablet (500 mg total) by mouth 2 (two) times daily. 12/21/21   Henderly, Britni A, PA-C  naproxen (NAPROSYN) 500 MG tablet Take 1 tablet (500 mg total) by mouth 2 (two) times daily. 12/21/21   Henderly, Britni A, PA-C  ondansetron (ZOFRAN-ODT) 4 MG disintegrating tablet Take 1 tablet (4 mg total) by mouth every 8 (eight) hours as needed for nausea or vomiting. 09/29/21   Teodora Medici, FNP  pantoprazole (PROTONIX) 40 MG tablet Take 1 tablet (40 mg total) by mouth daily. 08/11/21   Elsie Stain, MD  TRUEplus Lancets 28G MISC Use to measure blood sugar twice a day 05/11/21   Elsie Stain, MD  Family History Family History  Problem Relation Age of Onset   Diabetes Mother    Hypertension Father    Diabetes Father     Social History Social History   Tobacco Use   Smoking status: Never   Smokeless tobacco: Never  Vaping Use   Vaping Use: Never used  Substance Use Topics   Alcohol use: No   Drug use: No     Allergies   Statins   Review of Systems Review of Systems  Constitutional:  Negative for chills and fever.  HENT:  Positive for dental problem.   Eyes:  Negative for discharge and redness.  Respiratory:  Negative for shortness of breath.   Gastrointestinal:  Negative for nausea and vomiting.  Neurological:  Negative for numbness.     Physical Exam Triage Vital Signs ED Triage Vitals   Enc Vitals Group     BP 04/26/22 1215 128/89     Pulse Rate 04/26/22 1215 69     Resp 04/26/22 1215 18     Temp 04/26/22 1215 98.3 F (36.8 C)     Temp Source 04/26/22 1215 Oral     SpO2 04/26/22 1215 93 %     Weight --      Height --      Head Circumference --      Peak Flow --      Pain Score 04/26/22 1214 10     Pain Loc --      Pain Edu? --      Excl. in [place]? --    No data found.  Updated Vital Signs BP 128/89 (BP Location: Left Arm)   Pulse 69   Temp 98.3 F (36.8 C) (Oral)   Resp 18   SpO2 93%      Physical Exam Vitals and nursing note reviewed.  Constitutional:      General: He is not in acute distress.    Appearance: Normal appearance. He is not ill-appearing.  HENT:     Head: Normocephalic and atraumatic.     Mouth/Throat:     Comments: Diffuse gingival swelling to left upper molars Eyes:     Conjunctiva/sclera: Conjunctivae normal.  Cardiovascular:     Rate and Rhythm: Normal rate.  Pulmonary:     Effort: Pulmonary effort is normal.  Neurological:     Mental Status: He is alert.  Psychiatric:        Mood and Affect: Mood normal.        Behavior: Behavior normal.        Thought Content: Thought content normal.      UC Treatments / Results  Labs (all labs ordered are listed, but only abnormal results are displayed) Labs Reviewed - No data to display  EKG   Radiology No results found.  Procedures Procedures (including critical care time)  Medications Ordered in UC Medications  ketorolac (TORADOL) injection 60 mg (60 mg Intramuscular Given 04/26/22 1241)    Initial Impression / Assessment and Plan / UC Course  I have reviewed the triage vital signs and the nursing notes.  Pertinent labs & imaging results that were available during my care of the patient were reviewed by me and considered in my medical decision making (see chart for details).    Antibiotic prescribed to cover abscess. Toradol injection administered in office for  hopeful pain relief. Will prescribe ibuprofen to start tomorrow.  Encourage follow up with dentistry. Recommend sooner follow up in ED with any worsening symptoms.  Final Clinical Impressions(s) / UC Diagnoses   Final diagnoses:  Dental abscess     Discharge Instructions      Start antibiotic today- start ibuprofen tomorrow.   Follow up with dentist as soon as you are able.   Follow up in ED with any worsening symptoms.      ED Prescriptions     Medication Sig Dispense Auth. Provider   amoxicillin (AMOXIL) 500 MG capsule Take 1 capsule (500 mg total) by mouth 3 (three) times daily. 21 capsule Ewell Poe F, PA-C   ibuprofen (ADVIL) 600 MG tablet Take 1 tablet (600 mg total) by mouth every 6 (six) hours as needed. 30 tablet Francene Finders, PA-C      PDMP not reviewed this encounter.   Francene Finders, PA-C 04/26/22 [phone]

## 2022-04-26 NOTE — ED Triage Notes (Signed)
Pt presents with left side dental pain X 2 days. 

## 2022-05-23 ENCOUNTER — Ambulatory Visit (HOSPITAL_COMMUNITY)
Admission: EM | Admit: 2022-05-23 | Discharge: 2022-05-23 | Disposition: A | Discharge status: Home / Self Care | Payer: Self-pay | Attending: Internal Medicine | Admitting: Internal Medicine

## 2022-05-23 ENCOUNTER — Other Ambulatory Visit: Payer: Self-pay

## 2022-05-23 ENCOUNTER — Encounter (HOSPITAL_COMMUNITY): Payer: Self-pay | Admitting: Emergency Medicine

## 2022-05-23 DIAGNOSIS — K0889 Other specified disorders of teeth and supporting structures: Secondary | ICD-10-CM

## 2022-05-23 DIAGNOSIS — K047 Periapical abscess without sinus: Secondary | ICD-10-CM

## 2022-05-23 MED ORDER — KETOROLAC TROMETHAMINE 30 MG/ML IJ SOLN
30.0000 mg | Freq: Once | INTRAMUSCULAR | Status: AC
Start: 2022-05-23 — End: 2022-05-23
  Administered 2022-05-23: 30 mg via INTRAMUSCULAR

## 2022-05-23 MED ORDER — KETOROLAC TROMETHAMINE 30 MG/ML IJ SOLN
INTRAMUSCULAR | Status: AC
  Filled 2022-05-23: qty 1

## 2022-05-23 MED ORDER — IBUPROFEN 800 MG PO TABS
800.0000 mg | ORAL_TABLET | Freq: Three times a day (TID) | ORAL | 0 refills | Status: DC
  Filled 2022-05-23: qty 21, 7d supply, fill #0

## 2022-05-23 MED ORDER — CLINDAMYCIN HCL 300 MG PO CAPS
300.0000 mg | ORAL_CAPSULE | Freq: Three times a day (TID) | ORAL | 0 refills | Status: AC
  Filled 2022-05-23: qty 21, 7d supply, fill #0

## 2022-05-23 NOTE — ED Provider Notes (Signed)
[facility]    CSN: [id number] Arrival date & time: 05/23/22  0801      History   Chief Complaint Chief Complaint  Patient presents with   Dental Pain    HPI Tyrone Steele is a 36 y.o. male.   Patient presents urgent care for evaluation of left-sided dental abscess to the alveolar mucosa of the left side of the mouth that has been present for the last month.  He was seen at urgent care 1 month ago, diagnosed with dental abscess, placed on amoxicillin, and advised to follow-up with a dentist.  Patient scheduled an appointment with a dentist but states that they did not want to hold the tube for draining the abscess since it appeared to be infected.  The pain and swelling to the abscess improved significantly after antibiotics 4 weeks ago but returned a couple of days ago.  He was unable to sleep very well last night due to the discomfort to his mouth.  He is tolerating food and liquids without difficulty.  No fever/chills, decreased movement of the jaw, generalized body aches, URI symptoms, nausea, vomiting, abdominal pain, chest pain, shortness of breath, sore throat, ear pain, neck pain, or dizziness.  He has not seen any drainage from the abscess and has continued to brush his teeth is normal although he has experienced significant tenderness when doing so.  He does not have dental insurance and does not routinely see a dentist for cleanings.     Past Medical History:  Diagnosis Date   Anemia    Diabetes mellitus without complication [facility])     Patient Active Problem List   Diagnosis Date Noted   Neuropathy due to type 2 diabetes mellitus ([place]) 03/09/2021   Hyperlipidemia associated with type 2 diabetes mellitus ([place]) 12/02/2020   GERD without esophagitis 12/02/2020   Type 2 diabetes mellitus ([place]) 01/03/2019    Past Surgical History:  Procedure Laterality Date   NO PAST SURGERIES         Home Medications    Prior to Admission medications    Medication Sig Start Date End Date Taking? Authorizing Provider  clindamycin (CLEOCIN) 300 MG capsule Take 1 capsule (300 mg total) by mouth 3 (three) times daily for 7 days. 05/23/22 05/30/22 Yes Talbot Grumbling, FNP  ibuprofen (ADVIL) 800 MG tablet Take 1 tablet (800 mg total) by mouth 3 (three) times daily. 05/23/22  Yes Talbot Grumbling, FNP  amoxicillin (AMOXIL) 500 MG capsule Take 1 capsule (500 mg total) by mouth 3 (three) times daily. 04/26/22   Francene Finders, PA-C  Blood Glucose Monitoring Suppl (TRUE METRIX METER) w/Device KIT Use to measure blood sugar twice a day 03/09/21   Elsie Stain, MD  Dulaglutide (TRULICITY) 1.5 PI/9.5JO SOPN Inject 1.5 mg into the skin once a week. 08/11/21   Elsie Stain, MD  fluticasone (FLONASE) 50 MCG/ACT nasal spray Place 1 spray into both nostrils daily. 11/01/21 12/01/21  Elba Barman, DO  gabapentin (NEURONTIN) 100 MG capsule Take 3 capsules (300 mg total) by mouth 2 (two) times daily. 08/11/21   Elsie Stain, MD  glucose blood (TRUE METRIX BLOOD GLUCOSE TEST) test strip Use as instructed 08/11/21   Elsie Stain, MD  lidocaine (LIDODERM) 5 % Place 1 patch onto the skin daily. Remove & Discard patch within 12 hours or as directed by MD 12/21/21   Henderly, Britni A, PA-C  lisinopril (ZESTRIL) 5 MG tablet Take 1 tablet (5  mg total) by mouth daily. 09/01/21   Argentina Donovan, PA-C  lisinopril (ZESTRIL) 5 MG tablet take 1 tab by mouth daily 09/01/21     metFORMIN (GLUCOPHAGE-XR) 500 MG 24 hr tablet Take 2 tablets (1,000 mg total) by mouth 2 (two) times daily with a meal. 08/11/21   Elsie Stain, MD  methocarbamol (ROBAXIN) 500 MG tablet Take 1 tablet (500 mg total) by mouth 2 (two) times daily. 12/21/21   Henderly, Britni A, PA-C  naproxen (NAPROSYN) 500 MG tablet Take 1 tablet (500 mg total) by mouth 2 (two) times daily. 12/21/21   Henderly, Britni A, PA-C  ondansetron (ZOFRAN-ODT) 4 MG disintegrating tablet Take 1 tablet (4 mg total)  by mouth every 8 (eight) hours as needed for nausea or vomiting. 09/29/21   Teodora Medici, FNP  pantoprazole (PROTONIX) 40 MG tablet Take 1 tablet (40 mg total) by mouth daily. 08/11/21   Elsie Stain, MD  TRUEplus Lancets 28G MISC Use to measure blood sugar twice a day 05/11/21   Elsie Stain, MD    Family History Family History  Problem Relation Age of Onset   Diabetes Mother    Hypertension Father    Diabetes Father     Social History Social History   Tobacco Use   Smoking status: Never   Smokeless tobacco: Never  Vaping Use   Vaping Use: Never used  Substance Use Topics   Alcohol use: No   Drug use: No     Allergies   Statins   Review of Systems Review of Systems Per HPI  Physical Exam Triage Vital Signs ED Triage Vitals  Enc Vitals Group     BP 05/23/22 0816 139/77     Pulse Rate 05/23/22 0816 72     Resp 05/23/22 0816 17     Temp 05/23/22 0816 98.3 F (36.8 C)     Temp Source 05/23/22 0816 Oral     SpO2 05/23/22 0816 96 %     Weight --      Height --      Head Circumference --      Peak Flow --      Pain Score 05/23/22 0817 9     Pain Loc --      Pain Edu? --      Excl. in [place]? --    No data found.  Updated Vital Signs BP 139/77 (BP Location: Right Arm)   Pulse 72   Temp 98.3 F (36.8 C) (Oral)   Resp 17   SpO2 95%   Visual Acuity Right Eye Distance:   Left Eye Distance:   Bilateral Distance:    Right Eye Near:   Left Eye Near:    Bilateral Near:     Physical Exam Vitals and nursing note reviewed.  Constitutional:      Appearance: He is not ill-appearing or toxic-appearing.  HENT:     Head: Normocephalic and atraumatic.     Right Ear: Hearing and external ear normal.     Left Ear: Hearing and external ear normal.     Nose: Nose normal.     Mouth/Throat:     Lips: Pink.     Dentition: Abnormal dentition. Gingival swelling and dental abscesses present.     Tongue: No lesions. Tongue does not deviate from midline.      Pharynx: Oropharynx is clear. Uvula midline. No pharyngeal swelling or posterior oropharyngeal erythema.     Tonsils: No tonsillar exudate or tonsillar  abscesses.     Comments: Dental abscess to the left upper alveolar tissue as seen below in image.  Abscess is firm to palpation and without drainage.  Abscess is tender to palpation and is localized.  No palpable abscess extending into the soft tissue of the mandibular/maxillary area. Eyes:     General: Lids are normal. Vision grossly intact. Gaze aligned appropriately.     Extraocular Movements: Extraocular movements intact.     Conjunctiva/sclera: Conjunctivae normal.  Pulmonary:     Effort: Pulmonary effort is normal.  Musculoskeletal:     Cervical back: Neck supple.  Skin:    General: Skin is warm and dry.     Capillary Refill: Capillary refill takes less than 2 seconds.     Findings: No rash.  Neurological:     General: No focal deficit present.     Mental Status: He is alert and oriented to person, place, and time. Mental status is at baseline.     Cranial Nerves: No dysarthria or facial asymmetry.  Psychiatric:        Mood and Affect: Mood normal.        Speech: Speech normal.        Behavior: Behavior normal.        Thought Content: Thought content normal.        Judgment: Judgment normal.       UC Treatments / Results  Labs (all labs ordered are listed, but only abnormal results are displayed) Labs Reviewed - No data to display  EKG   Radiology No results found.  Procedures Procedures (including critical care time)  Medications Ordered in UC Medications  ketorolac (TORADOL) 30 MG/ML injection 30 mg (30 mg Intramuscular Given 05/23/22 0840)    Initial Impression / Assessment and Plan / UC Course  I have reviewed the triage vital signs and the nursing notes.  Pertinent labs & imaging results that were available during my care of the patient were reviewed by me and considered in my medical decision making (see  chart for details).   1.  Dental abscess Patient was given ketorolac at last visit and states that this helped with his pain significantly.  We will repeat this today and up his ibuprofen dose to 800 mg every 8 hours as needed for dental pain and inflammation.  He is to avoid taking ibuprofen until later tonight at around 10 PM to 11 PM since he was given the ketorolac injection in the clinic today.  Advised to take this with food to avoid stomach upset.  Patient to take clindamycin 3 times daily for the next 7 days to treat infected dental abscess.  Walking referral to community dentists given.  Patient advised to call dentist to schedule an appointment for follow-up.  Warm salt water gargles and warm compresses to the left side of the mouth advised to help with infection.   Discussed physical exam and available lab work findings in clinic with patient.  Counseled patient regarding appropriate use of medications and potential side effects for all medications recommended or prescribed today. Discussed red flag signs and symptoms of worsening condition,when to call the PCP office, return to urgent care, and when to seek higher level of care in the emergency department. Patient verbalizes understanding and agreement with plan. All questions answered. Patient discharged in stable condition.    Final Clinical Impressions(s) / UC Diagnoses   Final diagnoses:  Dental abscess     Discharge Instructions      Take  clindamycin antibiotic 3 times daily for the next 7 days with food. Apply warmth to the left side of the face and use warm salt water gargles to help with infection. We gave you an injection of ketorolac in the clinic today which is a similar medication to ibuprofen.  Your next dose of ibuprofen may be tonight. I have upped your dose of ibuprofen to 800 mg every 8 hours as needed for dental pain. Please contact one of the dentists listed on your paperwork to schedule an appointment for  further evaluation and possible incision and drainage of your abscess.  If you develop any new or worsening symptoms or do not improve in the next 2 to 3 days, please return.  If your symptoms are severe, please go to the emergency room.  Follow-up with your primary care provider for further evaluation and management of your symptoms as well as ongoing wellness visits.  I hope you feel better!    ED Prescriptions     Medication Sig Dispense Auth. Provider   clindamycin (CLEOCIN) 300 MG capsule Take 1 capsule (300 mg total) by mouth 3 (three) times daily for 7 days. 21 capsule Joella Prince M, FNP   ibuprofen (ADVIL) 800 MG tablet Take 1 tablet (800 mg total) by mouth 3 (three) times daily. 21 tablet Talbot Grumbling, FNP      PDMP not reviewed this encounter.   Joella Prince Anthony, [place] 05/23/22 [phone]

## 2022-05-23 NOTE — Discharge Instructions (Addendum)
Take clindamycin antibiotic 3 times daily for the next 7 days with food. Apply warmth to the left side of the face and use warm salt water gargles to help with infection. We gave you an injection of ketorolac in the clinic today which is a similar medication to ibuprofen.  Your next dose of ibuprofen may be tonight. I have upped your dose of ibuprofen to 800 mg every 8 hours as needed for dental pain. Please contact one of the dentists listed on your paperwork to schedule an appointment for further evaluation and possible incision and drainage of your abscess.  If you develop any new or worsening symptoms or do not improve in the next 2 to 3 days, please return.  If your symptoms are severe, please go to the emergency room.  Follow-up with your primary care provider for further evaluation and management of your symptoms as well as ongoing wellness visits.  I hope you feel better!

## 2022-05-23 NOTE — ED Triage Notes (Signed)
Pt reports left side dental pain since Sunday. States he was seen at an UC a month ago for same and dentist would not pull the tooth.

## 2022-09-29 ENCOUNTER — Other Ambulatory Visit: Payer: Self-pay

## 2022-09-29 ENCOUNTER — Ambulatory Visit (HOSPITAL_COMMUNITY)
Admission: EM | Admit: 2022-09-29 | Discharge: 2022-09-29 | Disposition: A | Discharge status: Home / Self Care | Payer: Self-pay | Attending: Sports Medicine | Admitting: Sports Medicine

## 2022-09-29 ENCOUNTER — Encounter (HOSPITAL_COMMUNITY): Payer: Self-pay | Admitting: Emergency Medicine

## 2022-09-29 DIAGNOSIS — R112 Nausea with vomiting, unspecified: Secondary | ICD-10-CM

## 2022-09-29 DIAGNOSIS — R197 Diarrhea, unspecified: Secondary | ICD-10-CM

## 2022-09-29 MED ORDER — ONDANSETRON 4 MG PO TBDP
4.0000 mg | ORAL_TABLET | Freq: Once | ORAL | Status: AC
Start: 2022-09-29 — End: 2022-09-29
  Administered 2022-09-29: 4 mg via ORAL

## 2022-09-29 MED ORDER — ONDANSETRON HCL 4 MG PO TABS: 4.0000 mg | ORAL_TABLET | Freq: Three times a day (TID) | ORAL | 0 refills | Status: DC | PRN

## 2022-09-29 MED ORDER — ONDANSETRON 4 MG PO TBDP
ORAL_TABLET | ORAL | Status: AC
  Filled 2022-09-29: qty 1

## 2022-09-29 NOTE — ED Triage Notes (Signed)
Pt c/o vomiting and diarrhea that started today, states his son was sick with same during the weekend.

## 2022-09-29 NOTE — ED Provider Notes (Signed)
[place]    CSN: [id number] Arrival date & time: 09/29/22  0920      History   Chief Complaint Chief Complaint  Patient presents with   Emesis    HPI Tyrone Steele is a 37 y.o. male.   He is here today with chief complaint of nausea and 1 episode of vomiting and diarrhea since this morning.  He reports he has had 3 loose stools since getting up this morning he was little dizzy when he first sprung out of bed.  He also has noticed some nausea throughout the morning.  He has been able to keep down a couple of sips of orange Gatorade this morning.  He reports his son was sick with diarrhea and upper respiratory infection earlier this week.  He was at work this morning however they sent him home today because he did not want him infecting others.  He denies any fevers, abdominal cramping or blood in his stools.   Emesis   Past Medical History:  Diagnosis Date   Anemia    Diabetes mellitus without complication [facility])     Patient Active Problem List   Diagnosis Date Noted   Neuropathy due to type 2 diabetes mellitus ([facility]) 03/09/2021   Hyperlipidemia associated with type 2 diabetes mellitus ([place]) 12/02/2020   GERD without esophagitis 12/02/2020   Type 2 diabetes mellitus ([place]) 01/03/2019    Past Surgical History:  Procedure Laterality Date   NO PAST SURGERIES         Home Medications    Prior to Admission medications   Medication Sig Start Date End Date Taking? Authorizing Provider  ondansetron (ZOFRAN) 4 MG tablet Take 1 tablet (4 mg total) by mouth every 8 (eight) hours as needed for nausea or vomiting. 09/29/22  Yes Rodena Goldmann A, DO  amoxicillin (AMOXIL) 500 MG capsule Take 1 capsule (500 mg total) by mouth 3 (three) times daily. 04/26/22   Francene Finders, PA-C  Blood Glucose Monitoring Suppl (TRUE METRIX METER) w/Device KIT Use to measure blood sugar twice a day 03/09/21   Elsie Stain, MD  Dulaglutide (TRULICITY) 1.5 0000000 SOPN Inject  1.5 mg into the skin once a week. 08/11/21   Elsie Stain, MD  fluticasone (FLONASE) 50 MCG/ACT nasal spray Place 1 spray into both nostrils daily. 11/01/21 12/01/21  Elba Barman, DO  gabapentin (NEURONTIN) 100 MG capsule Take 3 capsules (300 mg total) by mouth 2 (two) times daily. 08/11/21   Elsie Stain, MD  glucose blood (TRUE METRIX BLOOD GLUCOSE TEST) test strip Use as instructed 08/11/21   Elsie Stain, MD  ibuprofen (ADVIL) 800 MG tablet Take 1 tablet (800 mg total) by mouth 3 (three) times daily. 05/23/22   Talbot Grumbling, FNP  lidocaine (LIDODERM) 5 % Place 1 patch onto the skin daily. Remove & Discard patch within 12 hours or as directed by MD 12/21/21   Henderly, Britni A, PA-C  lisinopril (ZESTRIL) 5 MG tablet Take 1 tablet (5 mg total) by mouth daily. 09/01/21   Argentina Donovan, PA-C  lisinopril (ZESTRIL) 5 MG tablet take 1 tab by mouth daily 09/01/21     metFORMIN (GLUCOPHAGE-XR) 500 MG 24 hr tablet Take 2 tablets (1,000 mg total) by mouth 2 (two) times daily with a meal. 08/11/21   Elsie Stain, MD  methocarbamol (ROBAXIN) 500 MG tablet Take 1 tablet (500 mg total) by mouth 2 (two) times daily. 12/21/21   Henderly, Britni A, PA-C  naproxen (NAPROSYN) 500 MG tablet Take 1 tablet (500 mg total) by mouth 2 (two) times daily. 12/21/21   Henderly, Britni A, PA-C  pantoprazole (PROTONIX) 40 MG tablet Take 1 tablet (40 mg total) by mouth daily. 08/11/21   Elsie Stain, MD  TRUEplus Lancets 28G MISC Use to measure blood sugar twice a day 05/11/21   Elsie Stain, MD    Family History Family History  Problem Relation Age of Onset   Diabetes Mother    Hypertension Father    Diabetes Father     Social History Social History   Tobacco Use   Smoking status: Never   Smokeless tobacco: Never  Vaping Use   Vaping Use: Never used  Substance Use Topics   Alcohol use: No   Drug use: No     Allergies   Statins   Review of Systems Review of Systems   Gastrointestinal:  Positive for vomiting.  As listed above in HPI   Physical Exam Triage Vital Signs ED Triage Vitals  Enc Vitals Group     BP 09/29/22 0951 133/83     Pulse Rate 09/29/22 0951 73     Resp 09/29/22 0951 18     Temp 09/29/22 0951 98.6 F (37 C)     Temp Source 09/29/22 0951 Oral     SpO2 09/29/22 0951 98 %     Weight 09/29/22 0952 217 lb 13 oz (98.8 kg)     Height 09/29/22 0952 '5\' 9"'$  (1.753 m)     Head Circumference --      Peak Flow --      Pain Score 09/29/22 0952 4     Pain Loc --      Pain Edu? --      Excl. in [place]? --    No data found.  Updated Vital Signs BP 133/83 (BP Location: Right Arm)   Pulse 73   Temp 98.6 F (37 C) (Oral)   Resp 18   Ht '5\' 9"'$  (1.753 m)   Wt 98.8 kg   SpO2 98%   BMI 32.17 kg/m   Physical Exam Vitals reviewed.  Constitutional:      General: He is not in acute distress.    Appearance: Normal appearance. He is obese. He is not ill-appearing, toxic-appearing or diaphoretic.  HENT:     Mouth/Throat:     Mouth: Mucous membranes are moist.  Cardiovascular:     Rate and Rhythm: Normal rate.     Pulses: Normal pulses.  Pulmonary:     Effort: Pulmonary effort is normal.  Abdominal:     General: Abdomen is flat. Bowel sounds are normal. There is no distension.     Palpations: Abdomen is soft.     Tenderness: There is no abdominal tenderness. There is no guarding.  Neurological:     Mental Status: He is alert.      UC Treatments / Results  Labs (all labs ordered are listed, but only abnormal results are displayed) Labs Reviewed - No data to display  EKG   Radiology No results found.  Procedures Procedures (including critical care time)  Medications Ordered in UC Medications  ondansetron (ZOFRAN-ODT) disintegrating tablet 4 mg (4 mg Oral Given 09/29/22 1008)    Initial Impression / Assessment and Plan / UC Course  I have reviewed the triage vital signs and the nursing notes.  Pertinent labs & imaging  results that were available during my care of the patient were reviewed by me  and considered in my medical decision making (see chart for details).     Nausea, emesis and diarrhea, likely viral gastroenteritis He received 1 dose of Zofran 4 mg here today and I also sent a prescription of Zofran to his pharmacy.  Recommend frequent handwashing, small bland meals and adequate hydration.  Recommend follow-up with his primary care provider back in the urgent care if symptoms worsen or fail to improve over the next few days.  He verbalized understanding.  Work note was provided today. Final Clinical Impressions(s) / UC Diagnoses   Final diagnoses:  Nausea vomiting and diarrhea     Discharge Instructions      I believe her symptoms are likely secondary to a viral process.  I have sent to your pharmacy some medicine to help with your nausea and vomiting the may take a couple times a day as needed.  I recommend small bland meals and adequate hydration.  Follow-up with your primary care provider back to the urgent care if your symptoms worsen or fail to improve.    ED Prescriptions     Medication Sig Dispense Auth. Provider   ondansetron (ZOFRAN) 4 MG tablet Take 1 tablet (4 mg total) by mouth every 8 (eight) hours as needed for nausea or vomiting. 20 tablet Rodena Goldmann A, DO      PDMP not reviewed this encounter.   Elmore Guise, DO 09/29/22 1032

## 2022-09-29 NOTE — Discharge Instructions (Signed)
I believe her symptoms are likely secondary to a viral process.  I have sent to your pharmacy some medicine to help with your nausea and vomiting the may take a couple times a day as needed.  I recommend small bland meals and adequate hydration.  Follow-up with your primary care provider back to the urgent care if your symptoms worsen or fail to improve.

## 2022-12-10 ENCOUNTER — Other Ambulatory Visit (HOSPITAL_COMMUNITY): Payer: Self-pay

## 2023-03-28 ENCOUNTER — Emergency Department (HOSPITAL_COMMUNITY): Payer: No Typology Code available for payment source

## 2023-03-28 ENCOUNTER — Encounter (HOSPITAL_COMMUNITY): Payer: Self-pay

## 2023-03-28 ENCOUNTER — Other Ambulatory Visit: Payer: Self-pay

## 2023-03-28 ENCOUNTER — Emergency Department (HOSPITAL_COMMUNITY)
Admission: EM | Admit: 2023-03-28 | Discharge: 2023-03-28 | Discharge status: Against Medical Advice | Payer: 59 | Attending: Emergency Medicine | Admitting: Emergency Medicine

## 2023-03-28 DIAGNOSIS — E119 Type 2 diabetes mellitus without complications: Secondary | ICD-10-CM | POA: Insufficient documentation

## 2023-03-28 DIAGNOSIS — Z5321 Procedure and treatment not carried out due to patient leaving prior to being seen by health care provider: Secondary | ICD-10-CM | POA: Insufficient documentation

## 2023-03-28 DIAGNOSIS — Y92410 Unspecified street and highway as the place of occurrence of the external cause: Secondary | ICD-10-CM | POA: Insufficient documentation

## 2023-03-28 DIAGNOSIS — M549 Dorsalgia, unspecified: Secondary | ICD-10-CM | POA: Diagnosis not present

## 2023-03-28 DIAGNOSIS — M545 Low back pain, unspecified: Secondary | ICD-10-CM | POA: Insufficient documentation

## 2023-03-28 DIAGNOSIS — M542 Cervicalgia: Secondary | ICD-10-CM | POA: Insufficient documentation

## 2023-03-28 DIAGNOSIS — Z7984 Long term (current) use of oral hypoglycemic drugs: Secondary | ICD-10-CM | POA: Insufficient documentation

## 2023-03-28 DIAGNOSIS — R739 Hyperglycemia, unspecified: Secondary | ICD-10-CM | POA: Diagnosis not present

## 2023-03-28 DIAGNOSIS — Z743 Need for continuous supervision: Secondary | ICD-10-CM | POA: Diagnosis not present

## 2023-03-28 DIAGNOSIS — Z79899 Other long term (current) drug therapy: Secondary | ICD-10-CM | POA: Insufficient documentation

## 2023-03-28 NOTE — ED Notes (Signed)
Pt and family stated that the wait time was too long. Pt seen leaving the ED
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
## 2023-03-29 ENCOUNTER — Other Ambulatory Visit (HOSPITAL_COMMUNITY): Payer: Self-pay

## 2023-03-29 ENCOUNTER — Other Ambulatory Visit: Payer: Self-pay

## 2023-03-29 ENCOUNTER — Emergency Department (HOSPITAL_COMMUNITY)
Admission: EM | Admit: 2023-03-29 | Discharge: 2023-03-29 | Disposition: A | Discharge status: Home / Self Care | Payer: 59 | Source: Home / Self Care | Attending: Emergency Medicine | Admitting: Emergency Medicine

## 2023-03-29 ENCOUNTER — Encounter (HOSPITAL_COMMUNITY): Payer: Self-pay

## 2023-03-29 DIAGNOSIS — M542 Cervicalgia: Secondary | ICD-10-CM | POA: Diagnosis not present

## 2023-03-29 MED ORDER — NAPROXEN 500 MG PO TABS
500.0000 mg | ORAL_TABLET | Freq: Two times a day (BID) | ORAL | 0 refills | Status: DC
  Filled 2023-03-29: qty 30, 15d supply, fill #0

## 2023-03-29 MED ORDER — METHOCARBAMOL 500 MG PO TABS
500.0000 mg | ORAL_TABLET | Freq: Two times a day (BID) | ORAL | 0 refills | Status: DC
  Filled 2023-03-29: qty 20, 10d supply, fill #0

## 2023-03-29 MED ORDER — KETOROLAC TROMETHAMINE 15 MG/ML IJ SOLN
15.0000 mg | Freq: Once | INTRAMUSCULAR | Status: AC
  Administered 2023-03-29: 15 mg via INTRAMUSCULAR
  Filled 2023-03-29: qty 1
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
## 2023-03-29 NOTE — Discharge Instructions (Signed)
Your imaging was reassuring. Please take the medications as prescribed. Do not drive while taking Robaxin

## 2023-03-31 ENCOUNTER — Ambulatory Visit (HOSPITAL_COMMUNITY)
Admission: EM | Admit: 2023-03-31 | Discharge: 2023-03-31 | Disposition: A | Discharge status: Home / Self Care | Payer: 59 | Attending: Family Medicine | Admitting: Family Medicine

## 2023-03-31 ENCOUNTER — Other Ambulatory Visit (HOSPITAL_COMMUNITY): Payer: Self-pay

## 2023-03-31 ENCOUNTER — Encounter (HOSPITAL_COMMUNITY): Payer: Self-pay

## 2023-03-31 DIAGNOSIS — M542 Cervicalgia: Secondary | ICD-10-CM

## 2023-03-31 DIAGNOSIS — M545 Low back pain, unspecified: Secondary | ICD-10-CM

## 2023-03-31 MED ORDER — DICLOFENAC SODIUM 75 MG PO TBEC
75.0000 mg | DELAYED_RELEASE_TABLET | Freq: Two times a day (BID) | ORAL | 0 refills | Status: DC | PRN
  Filled 2023-03-31: qty 20, 10d supply, fill #0

## 2023-03-31 NOTE — Discharge Instructions (Signed)
Diclofenac 75 mg--1 tablet 2 times daily as needed for pain (Stop naproxen and don't take ibuprofen with this medication)  Continue your methocarbamol at bedtime.  Apply local heat to the sore areas.  Try the stretching exercises.  Go see your primary care for poss PT orders.
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
## 2023-04-04 ENCOUNTER — Ambulatory Visit: Payer: 59 | Admitting: Physician Assistant

## 2023-04-04 ENCOUNTER — Ambulatory Visit: Payer: Self-pay | Admitting: *Deleted

## 2023-04-04 ENCOUNTER — Encounter: Payer: Self-pay | Admitting: Physician Assistant

## 2023-04-04 VITALS — BP 122/84 | HR 92 | Ht 70.0 in | Wt 204.0 lb

## 2023-04-04 DIAGNOSIS — M542 Cervicalgia: Secondary | ICD-10-CM | POA: Diagnosis not present

## 2023-04-04 DIAGNOSIS — M545 Low back pain, unspecified: Secondary | ICD-10-CM

## 2023-04-04 MED ORDER — KETOROLAC TROMETHAMINE 60 MG/2ML IM SOLN
60.0000 mg | Freq: Once | INTRAMUSCULAR | Status: AC
  Administered 2023-04-04: 60 mg via INTRAMUSCULAR

## 2023-04-04 MED ORDER — METHYLPREDNISOLONE ACETATE 80 MG/ML IJ SUSP
120.0000 mg | Freq: Once | INTRAMUSCULAR | Status: AC
  Administered 2023-04-04: 120 mg via INTRAMUSCULAR

## 2023-04-04 NOTE — Patient Instructions (Addendum)
I encourage you to start the Voltaren as prescribed by the emergency department, however you will start this in 24 hours due to the injections given today.  I encourage you to make sure you are staying well-hydrated, do gentle stretching, and use ice as needed.  I encourage you to return to the mobile unit at your earliest convenience for a full physical.  Roney Jaffe, PA-C Physician Assistant [facility] [email or web address]  Acute Back Pain, Adult Acute back pain is sudden and usually short-lived. It is often caused by an injury to the muscles and tissues in the back. The injury may result from: A muscle, tendon, or ligament getting overstretched or torn. Ligaments are tissues that connect bones to each other. Lifting something improperly can cause a back strain. Wear and tear (degeneration) of the spinal disks. Spinal disks are circular tissue that provide cushioning between the bones of the spine (vertebrae). Twisting motions, such as while playing sports or doing yard work. A hit to the back. Arthritis. You may have a physical exam, lab tests, and imaging tests to find the cause of your pain. Acute back pain usually goes away with rest and home care. Follow these instructions at home: Managing pain, stiffness, and swelling Take over-the-counter and prescription medicines only as told by your health care provider. Treatment may include medicines for pain and inflammation that are taken by mouth or applied to the skin, or muscle relaxants. Your health care provider may recommend applying ice during the first 24-48 hours after your pain starts. To do this: Put ice in a plastic bag. Place a towel between your skin and the bag. Leave the ice on for 20 minutes, 2-3 times a day. Remove the ice if your skin turns bright red. This is very important. If you cannot feel pain, heat, or cold, you have a greater risk of damage to the area. If  directed, apply heat to the affected area as often as told by your health care provider. Use the heat source that your health care provider recommends, such as a moist heat pack or a heating pad. Place a towel between your skin and the heat source. Leave the heat on for 20-30 minutes. Remove the heat if your skin turns bright red. This is especially important if you are unable to feel pain, heat, or cold. You have a greater risk of getting burned. Activity  Do not stay in bed. Staying in bed for more than 1-2 days can delay your recovery. Sit up and stand up straight. Avoid leaning forward when you sit or hunching over when you stand. If you work at a desk, sit close to it so you do not need to lean over. Keep your chin tucked in. Keep your neck drawn back, and keep your elbows bent at a 90-degree angle (right angle). Sit high and close to the steering wheel when you drive. Add lower back (lumbar) support to your car seat, if needed. Take short walks on even surfaces as soon as you are able. Try to increase the length of time you walk each day. Do not sit, drive, or stand in one place for more than 30 minutes at a time. Sitting or standing for long periods of time can put stress on your back. Do not drive or use heavy machinery while taking prescription pain medicine. Use proper lifting techniques. When you bend and lift, use positions that put less stress on your back: [place] your knees. Keep the load close  to your body. Avoid twisting. Exercise regularly as told by your health care provider. Exercising helps your back heal faster and helps prevent back injuries by keeping muscles strong and flexible. Work with a physical therapist to make a safe exercise program, as recommended by your health care provider. Do any exercises as told by your physical therapist. Lifestyle Maintain a healthy weight. Extra weight puts stress on your back and makes it difficult to have good posture. Avoid activities  or situations that make you feel anxious or stressed. Stress and anxiety increase muscle tension and can make back pain worse. Learn ways to manage anxiety and stress, such as through exercise. General instructions Sleep on a firm mattress in a comfortable position. Try lying on your side with your knees slightly bent. If you lie on your back, put a pillow under your knees. Keep your head and neck in a straight line with your spine (neutral position) when using electronic equipment like smartphones or pads. To do this: Raise your smartphone or pad to look at it instead of bending your head or neck to look down. Put the smartphone or pad at the level of your face while looking at the screen. Follow your treatment plan as told by your health care provider. This may include: Cognitive or behavioral therapy. Acupuncture or massage therapy. Meditation or yoga. Contact a health care provider if: You have pain that is not relieved with rest or medicine. You have increasing pain going down into your legs or buttocks. Your pain does not improve after 2 weeks. You have pain at night. You lose weight without trying. You have a fever or chills. You develop nausea or vomiting. You develop abdominal pain. Get help right away if: You develop new bowel or bladder control problems. You have unusual weakness or numbness in your arms or legs. You feel faint. These symptoms may represent a serious problem that is an emergency. Do not wait to see if the symptoms will go away. Get medical help right away. Call your local emergency services (911 in the [place] Do not drive yourself to the hospital. Summary Acute back pain is sudden and usually short-lived. Use proper lifting techniques. When you bend and lift, use positions that put less stress on your back. Take over-the-counter and prescription medicines only as told by your health care provider, and apply heat or ice as told. This information is not intended  to replace advice given to you by your health care provider. Make sure you discuss any questions you have with your health care provider. Document Revised: 09/25/2020 Document Reviewed: 09/25/2020 Elsevier Patient Education  2024 [employer].

## 2023-04-04 NOTE — Telephone Encounter (Signed)
Noted . Patient going to [facility].
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
## 2023-04-04 NOTE — Progress Notes (Unsigned)
Established Patient Office Visit  Subjective   Patient ID: Tyrone Steele, male    DOB: [birth year]  Age: 37 y.o. MRN: [id number]  No chief complaint on file.   Has to stand up and keep pulling   Muscle relaxer makes sleepy  Stretching  Not constant pain      Past Medical History:  Diagnosis Date   Anemia    Diabetes mellitus without complication (HCC)    Social History   Socioeconomic History   Marital status: Single    Spouse name: Not on file   Number of children: Not on file   Years of education: Not on file   Highest education level: Not on file  Occupational History   Not on file  Tobacco Use   Smoking status: Never   Smokeless tobacco: Never  Vaping Use   Vaping status: Never Used  Substance and Sexual Activity   Alcohol use: No   Drug use: No   Sexual activity: Yes  Other Topics Concern   Not on file  Social History Narrative   Not on file   Social Determinants of Health   Financial Resource Strain: Not on file  Food Insecurity: Not on file  Transportation Needs: Not on file  Physical Activity: Not on file  Stress: Not on file  Social Connections: Not on file  Intimate Partner Violence: Not on file   Family History  Problem Relation Age of Onset   Diabetes Mother    Hypertension Father    Diabetes Father    Allergies  Allergen Reactions   Statins Nausea Only and Other (See Comments)    Severe fatigue    ROS    Objective:     There were no vitals taken for this visit. BP Readings from Last 3 Encounters:  03/31/23 133/89  03/29/23 (!) 152/116  03/28/23 (!) 133/98   Wt Readings from Last 3 Encounters:  03/31/23 210 lb (95.3 kg)  03/28/23 208 lb (94.3 kg)  09/29/22 217 lb 13 oz (98.8 kg)    Physical Exam      Assessment & Plan:   Problem List Items Addressed This Visit   None   No follow-ups on file.    Kasandra Knudsen Mayers, PA-C
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
## 2023-04-05 ENCOUNTER — Encounter: Payer: Self-pay | Admitting: Physician Assistant

## 2023-04-14 ENCOUNTER — Other Ambulatory Visit (HOSPITAL_COMMUNITY): Payer: Self-pay

## 2023-04-21 ENCOUNTER — Ambulatory Visit (INDEPENDENT_AMBULATORY_CARE_PROVIDER_SITE_OTHER): Payer: 59 | Admitting: Nurse Practitioner

## 2023-04-21 ENCOUNTER — Other Ambulatory Visit (HOSPITAL_COMMUNITY): Payer: Self-pay

## 2023-04-21 ENCOUNTER — Encounter: Payer: Self-pay | Admitting: Nurse Practitioner

## 2023-04-21 DIAGNOSIS — E1165 Type 2 diabetes mellitus with hyperglycemia: Secondary | ICD-10-CM | POA: Diagnosis not present

## 2023-04-21 DIAGNOSIS — I10 Essential (primary) hypertension: Secondary | ICD-10-CM | POA: Diagnosis not present

## 2023-04-21 DIAGNOSIS — E785 Hyperlipidemia, unspecified: Secondary | ICD-10-CM | POA: Diagnosis not present

## 2023-04-21 DIAGNOSIS — E1169 Type 2 diabetes mellitus with other specified complication: Secondary | ICD-10-CM | POA: Diagnosis not present

## 2023-04-21 DIAGNOSIS — M545 Low back pain, unspecified: Secondary | ICD-10-CM | POA: Insufficient documentation

## 2023-04-21 DIAGNOSIS — M542 Cervicalgia: Secondary | ICD-10-CM | POA: Diagnosis not present

## 2023-04-21 LAB — POCT GLYCOSYLATED HEMOGLOBIN (HGB A1C): Hemoglobin A1C: 11.5 % — AB (ref 4.0–5.6)

## 2023-04-21 MED ORDER — BLOOD GLUCOSE MONITOR SYSTEM W/DEVICE KIT
1.0000 | PACK | Freq: Two times a day (BID) | 0 refills | Status: DC
  Filled 2023-04-21: qty 1, 30d supply, fill #0

## 2023-04-21 MED ORDER — BLOOD GLUCOSE TEST VI STRP
1.0000 | ORAL_STRIP | Freq: Two times a day (BID) | 4 refills | Status: AC
  Filled 2023-04-21: qty 200, 100d supply, fill #0
  Filled 2023-12-26: qty 100, 50d supply, fill #1

## 2023-04-21 MED ORDER — TRULICITY 0.75 MG/0.5ML ~~LOC~~ SOAJ
0.7500 mg | SUBCUTANEOUS | 0 refills | Status: DC
  Filled 2023-04-21: qty 2, 28d supply, fill #0

## 2023-04-21 MED ORDER — LANCET DEVICE MISC
1.0000 | Freq: Two times a day (BID) | 0 refills | Status: AC
  Filled 2023-04-21: qty 1, 30d supply, fill #0

## 2023-04-21 MED ORDER — METFORMIN HCL ER 500 MG PO TB24
1000.0000 mg | ORAL_TABLET | Freq: Two times a day (BID) | ORAL | 3 refills | Status: DC
  Filled 2023-04-21: qty 120, 30d supply, fill #0
  Filled 2023-12-26: qty 120, 30d supply, fill #1

## 2023-04-21 MED ORDER — FREESTYLE LANCETS MISC
1.0000 | Freq: Two times a day (BID) | 4 refills | Status: AC
  Filled 2023-04-21: qty 100, 30d supply, fill #0
  Filled 2023-12-26: qty 100, 30d supply, fill #1

## 2023-04-21 NOTE — Assessment & Plan Note (Signed)
BP Readings from Last 3 Encounters:  04/21/23 116/61  04/04/23 122/84  03/31/23 133/89  Blood pressure is currently controlled without medication Will restart medication if blood pressure becomes greater than 130/80 DASH diet advised Engage in regular moderate to vigorous exercises at least 150 minutes weekly Follow-up in 4 weeks
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
## 2023-04-21 NOTE — Assessment & Plan Note (Signed)
Pain has improved and he is back to work Continue Robaxin 500 mg twice daily, diclofenac 75 mg twice daily as needed

## 2023-04-21 NOTE — Patient Instructions (Signed)
Goal for fasting blood sugar ranges from 80 to 120 and 2 hours after any meal or at bedtime should be between 130 to 170.    1. Type 2 diabetes mellitus with hyperglycemia, without long-term current use of insulin (HCC)  - POCT glycosylated hemoglobin (Hb A1C) - Microalbumin/Creatinine Ratio, Urine - CMP14+EGFR - Ambulatory referral to Ophthalmology - Ambulatory referral to diabetic education - Lipid panel - metFORMIN (GLUCOPHAGE-XR) 500 MG 24 hr tablet; Take 2 tablets (1,000 mg total) by mouth 2 (two) times daily with a meal.  Dispense: 180 tablet; Refill: 3 - Dulaglutide (TRULICITY) 0.75 MG/0.5ML SOPN; Inject 0.75 mg into the skin once a week.  Dispense: 2 mL; Refill: 0     It is important that you exercise regularly at least 30 minutes 5 times a week as tolerated  Think about what you will eat, plan ahead. Choose " clean, green, fresh or frozen" over canned, processed or packaged foods which are more sugary, salty and fatty. 70 to 75% of food eaten should be vegetables and fruit. Three meals at set times with snacks allowed between meals, but they must be fruit or vegetables. Aim to eat over a 12 hour period , example 7 am to 7 pm, and STOP after  your last meal of the day. Drink water,generally about 64 ounces per day, no other drink is as healthy. Fruit juice is best enjoyed in a healthy way, by EATING the fruit.  Thanks for choosing [facility] we consider it a privelige to serve you.

## 2023-04-21 NOTE — Assessment & Plan Note (Addendum)
Lab Results  Component Value Date   HGBA1C 11.5 (A) 04/21/2023    - POCT glycosylated hemoglobin (Hb A1C) - Microalbumin/Creatinine Ratio, Urine - CMP14+EGFR - Ambulatory referral to Ophthalmology - Ambulatory referral to diabetic education - Lipid panel - metFORMIN (GLUCOPHAGE-XR) 500 MG 24 hr tablet; Take 2 tablets (1,000 mg total) by mouth 2 (two) times daily with a meal.  Dispense: 180 tablet; Refill: 3 - Dulaglutide (TRULICITY) 0.75 MG/0.5ML SOPN; Inject 0.75 mg into the skin once a week.  Dispense: 2 mL; Refill: 0 CBG goals discussed Patient counseled on low-carb modified diet Not on a statin checking lipid panel LDL goal is less than 100 Encouraged to engage in regular moderate to vigorous exercises at least 150 minutes weekly Follow-up in the office in 4 weeks

## 2023-04-22 LAB — LIPID PANEL
Chol/HDL Ratio: 5.2 {ratio} — ABNORMAL HIGH (ref 0.0–5.0)
Cholesterol, Total: 188 mg/dL (ref 100–199)
HDL: 36 mg/dL — ABNORMAL LOW (ref >39)
LDL Chol Calc (NIH): 118 mg/dL — ABNORMAL HIGH (ref 0–99)
Triglycerides: 191 mg/dL — ABNORMAL HIGH (ref 0–149)
VLDL Cholesterol Cal: 34 mg/dL (ref 5–40)

## 2023-04-22 LAB — CMP14+EGFR
ALT: 27 [IU]/L (ref 0–44)
AST: 17 [IU]/L (ref 0–40)
Albumin: 4.5 g/dL (ref 4.1–5.1)
Alkaline Phosphatase: 64 [IU]/L (ref 44–121)
BUN/Creatinine Ratio: 16 (ref 9–20)
BUN: 16 mg/dL (ref 6–20)
Bilirubin Total: 0.4 mg/dL (ref 0.0–1.2)
CO2: 22 mmol/L (ref 20–29)
Calcium: 9.5 mg/dL (ref 8.7–10.2)
Chloride: 102 mmol/L (ref 96–106)
Creatinine, Ser: 0.97 mg/dL (ref 0.76–1.27)
Globulin, Total: 2.9 g/dL (ref 1.5–4.5)
Glucose: 245 mg/dL — ABNORMAL HIGH (ref 70–99)
Potassium: 4.2 mmol/L (ref 3.5–5.2)
Sodium: 139 mmol/L (ref 134–144)
Total Protein: 7.4 g/dL (ref 6.0–8.5)
eGFR: 102 mL/min/{1.73_m2} (ref >59)

## 2023-04-24 ENCOUNTER — Other Ambulatory Visit: Payer: Self-pay | Admitting: Nurse Practitioner

## 2023-04-24 ENCOUNTER — Other Ambulatory Visit: Payer: Self-pay

## 2023-04-24 ENCOUNTER — Other Ambulatory Visit (HOSPITAL_COMMUNITY): Payer: Self-pay

## 2023-04-24 DIAGNOSIS — E785 Hyperlipidemia, unspecified: Secondary | ICD-10-CM

## 2023-04-24 MED ORDER — ROSUVASTATIN CALCIUM 5 MG PO TABS
5.0000 mg | ORAL_TABLET | ORAL | 0 refills | Status: DC
  Filled 2023-04-24: qty 15, 30d supply, fill #0

## 2023-05-26 ENCOUNTER — Other Ambulatory Visit (HOSPITAL_COMMUNITY): Payer: Self-pay

## 2023-05-26 ENCOUNTER — Ambulatory Visit (INDEPENDENT_AMBULATORY_CARE_PROVIDER_SITE_OTHER): Payer: 59 | Admitting: Nurse Practitioner

## 2023-05-26 VITALS — BP 114/80 | HR 65 | Temp 97.3°F | Ht 68.0 in | Wt 201.0 lb

## 2023-05-26 DIAGNOSIS — I10 Essential (primary) hypertension: Secondary | ICD-10-CM

## 2023-05-26 DIAGNOSIS — B353 Tinea pedis: Secondary | ICD-10-CM | POA: Diagnosis not present

## 2023-05-26 DIAGNOSIS — E1169 Type 2 diabetes mellitus with other specified complication: Secondary | ICD-10-CM | POA: Diagnosis not present

## 2023-05-26 DIAGNOSIS — E785 Hyperlipidemia, unspecified: Secondary | ICD-10-CM

## 2023-05-26 DIAGNOSIS — L84 Corns and callosities: Secondary | ICD-10-CM | POA: Diagnosis not present

## 2023-05-26 MED ORDER — TRULICITY 1.5 MG/0.5ML ~~LOC~~ SOAJ
1.5000 mg | SUBCUTANEOUS | 2 refills | Status: DC
  Filled 2023-05-26 – 2023-06-07 (×2): qty 2, 28d supply, fill #0

## 2023-05-26 MED ORDER — TERBINAFINE HCL 1 % EX CREA
1.0000 | TOPICAL_CREAM | Freq: Two times a day (BID) | CUTANEOUS | 0 refills | Status: DC
  Filled 2023-05-26 – 2023-06-07 (×2): qty 30, 15d supply, fill #0

## 2023-05-26 NOTE — Assessment & Plan Note (Addendum)
Patient denies polyuria polyphagia polydipsia Currently on Trulicity 0.75 mg once weekly injection Metformin 500 mg twice daily instead of 1000 mg twice daily Stated his fasting blood sugar has been below 150s Stated that he has been following a low-carb modified diet drinking more water and exercising more Will increase Trulicity to 1.5 mg once weekly, take metformin 1000 mg twice daily Taking rosuvastatin 5 mg daily for hyperlipidemia, checking lipid panel Patient counseled on low-carb modified diet Encouraged to engage in regular moderate to vigorous exercises at least 150 minutes weekly Referral for diabetic eye exam is pending Diabetic foot exam completed today, checking urine microalbumin labs Follow-up in 2 months

## 2023-05-26 NOTE — Assessment & Plan Note (Signed)
  No redness or swelling noted. calluses noted bilaterally around the big toes.  Wondering if he can have them removed patient referred to podiatry

## 2023-05-26 NOTE — Assessment & Plan Note (Addendum)
Start terbinafine 1% cream apply to affected site twice daily Encouraged to wear cotton socks, wash between his toes, keep feet clean and dry daily.  Wear shoes that allow feet to get air

## 2023-05-26 NOTE — Assessment & Plan Note (Signed)
BP Readings from Last 3 Encounters:  05/26/23 114/80  04/21/23 116/61  04/04/23 122/84  Currently well-controlled without medication, BP goal is less than 130/80 DASH diet advised, engage in regular moderate to vigorous exercise at least 150 minutes weekly as tolerated

## 2023-05-26 NOTE — Patient Instructions (Addendum)
Ophthalmology Mateo Flow, MD none none  Please consider getting your influenza vaccine Please take metformin 1000 mg twice daily Start Trulicity 1.5 mg once weekly I have referred you for diabetic nutrition class  Goal for fasting blood sugar ranges from 80 to 120 and 2 hours after any meal or at bedtime should be between 130 to 170.    Hyperlipidemia associated with type 2 diabetes mellitus (HCC)  - Microalbumin/Creatinine Ratio, Urine - Lipid panel - Dulaglutide (TRULICITY) 1.5 MG/0.5ML SOAJ; Inject 1.5 mg into the skin once a week.  Dispense: 2 mL; Refill: 2 - Ambulatory referral to Podiatry - Ambulatory referral to diabetic education  Tinea pedis of both feet  - terbinafine (LAMISIL) 1 % cream; Apply 1 Application topically 2 (two) times daily.  Dispense: 30 g; Refill: 0        It is important that you exercise regularly at least 30 minutes 5 times a week as tolerated  Think about what you will eat, plan ahead. Choose " clean, green, fresh or frozen" over canned, processed or packaged foods which are more sugary, salty and fatty. 70 to 75% of food eaten should be vegetables and fruit. Three meals at set times with snacks allowed between meals, but they must be fruit or vegetables. Aim to eat over a 12 hour period , example 7 am to 7 pm, and STOP after  your last meal of the day. Drink water,generally about 64 ounces per day, no other drink is as healthy. Fruit juice is best enjoyed in a healthy way, by EATING the fruit.  Thanks for choosing [facility] we consider it a privelige to serve you.

## 2023-05-26 NOTE — Progress Notes (Signed)
Established Patient Office Visit  Subjective:  Patient ID: Tyrone Steele, male    DOB: 1986-05-19  Age: 37 y.o. MRN: [id number]  CC:  Chief Complaint  Patient presents with   Hyperlipidemia   Diabetes    HPI Tyrone Steele is a 37 y.o. male  has a past medical history of Anemia, Diabetes mellitus without complication (HCC), and GERD without esophagitis (12/02/2020).  Patient presents for follow-up for his chronic medical conditions Please see assessment and plan section for full HPI Patient denies any adverse reactions to current medications    Past Medical History:  Diagnosis Date   Anemia    Diabetes mellitus without complication (HCC)    GERD without esophagitis 12/02/2020    Past Surgical History:  Procedure Laterality Date   NO PAST SURGERIES      Family History  Problem Relation Age of Onset   Diabetes Mother    Hypertension Father    Diabetes Father    Stroke Cousin     Social History   Socioeconomic History   Marital status: Single    Spouse name: Not on file   Number of children: 8   Years of education: Not on file   Highest education level: GED or equivalent  Occupational History   Not on file  Tobacco Use   Smoking status: Never   Smokeless tobacco: Never  Vaping Use   Vaping status: Never Used  Substance and Sexual Activity   Alcohol use: No   Drug use: No   Sexual activity: Yes  Other Topics Concern   Not on file  Social History Narrative   Lives with his fiance   Social Determinants of Health   Financial Resource Strain: Medium Risk (05/26/2023)   Overall Financial Resource Strain (CARDIA)    Difficulty of Paying Living Expenses: Somewhat hard  Food Insecurity: Food Insecurity Present (05/26/2023)   Hunger Vital Sign    Worried About Running Out of Food in the Last Year: Never true    Ran Out of Food in the Last Year: Sometimes true  Transportation Needs: Unmet Transportation Needs (05/26/2023)   PRAPARE - Therapist, art (Medical): Yes    Lack of Transportation (Non-Medical): Yes  Physical Activity: Insufficiently Active (05/26/2023)   Exercise Vital Sign    Days of Exercise per Week: 5 days    Minutes of Exercise per Session: 10 min  Stress: Not on file  Social Connections: Moderately Isolated (05/26/2023)   Social Connection and Isolation Panel [NHANES]    Frequency of Communication with Friends and Family: Never    Frequency of Social Gatherings with Friends and Family: Once a week    Attends Religious Services: 1 to 4 times per year    Active Member of [employer] or Organizations: No    Attends Engineer, structural: Not on file    Marital Status: Living with partner  Intimate Partner Violence: Not on file    Outpatient Medications Prior to Visit  Medication Sig Dispense Refill   Blood Glucose Monitoring Suppl (BLOOD GLUCOSE MONITOR SYSTEM) w/Device KIT Test blood sugar in the morning and at bedtime. 1 kit 0   Glucose Blood (BLOOD GLUCOSE TEST STRIPS) STRP Test blood sugar 2 (two) times daily. 200 strip 4   Lancets (FREESTYLE) lancets Test blood sugar 2 (two) times daily. 200 each 4   metFORMIN (GLUCOPHAGE-XR) 500 MG 24 hr tablet Take 2 tablets (1,000 mg total) by mouth 2 (two) times daily  with a meal. 180 tablet 3   rosuvastatin (CRESTOR) 5 MG tablet Take 1 tablet (5 mg total) by mouth every other day. 90 tablet 0   TRUEplus Lancets 28G MISC Use to measure blood sugar twice a day 100 each 1   Dulaglutide (TRULICITY) 0.75 MG/0.5ML SOPN Inject 0.75 mg into the skin once a week. 2 mL 0   diclofenac (VOLTAREN) 75 MG EC tablet Take 1 tablet (75 mg total) by mouth 2 (two) times daily as needed (pain). (Patient not taking: Reported on 05/26/2023) 20 tablet 0   fluticasone (FLONASE) 50 MCG/ACT nasal spray Place 1 spray into both nostrils daily. (Patient not taking: Reported on 04/21/2023) 16 g 0   gabapentin (NEURONTIN) 100 MG capsule Take 3 capsules (300 mg total) by mouth 2 (two)  times daily. (Patient not taking: Reported on 04/21/2023) 60 capsule 1   methocarbamol (ROBAXIN) 500 MG tablet Take 1 tablet (500 mg total) by mouth 2 (two) times daily. 20 tablet 0   pantoprazole (PROTONIX) 40 MG tablet Take 1 tablet (40 mg total) by mouth daily. (Patient not taking: Reported on 04/21/2023) 30 tablet 3   No facility-administered medications prior to visit.    Allergies  Allergen Reactions   Statins Nausea Only and Other (See Comments)    Severe fatigue    ROS Review of Systems  Constitutional:  Negative for appetite change, chills, fatigue and fever.  HENT:  Negative for congestion, postnasal drip, rhinorrhea and sneezing.   Respiratory:  Negative for cough, shortness of breath and wheezing.   Cardiovascular:  Negative for chest pain, palpitations and leg swelling.  Gastrointestinal:  Negative for abdominal pain, constipation, nausea and vomiting.  Genitourinary:  Negative for difficulty urinating, dysuria, flank pain and frequency.  Musculoskeletal:  Negative for arthralgias, back pain, joint swelling and myalgias.  Skin:  Negative for color change, pallor, rash and wound.  Neurological:  Negative for dizziness, facial asymmetry, weakness, numbness and headaches.  Psychiatric/Behavioral:  Negative for behavioral problems, confusion, self-injury and suicidal ideas.       Objective:    Physical Exam Vitals and nursing note reviewed.  Constitutional:      General: He is not in acute distress.    Appearance: Normal appearance. He is not ill-appearing, toxic-appearing or diaphoretic.  HENT:     Mouth/Throat:     Mouth: Mucous membranes are moist.     Pharynx: Oropharynx is clear. No oropharyngeal exudate or posterior oropharyngeal erythema.  Eyes:     General: No scleral icterus.       Right eye: No discharge.        Left eye: No discharge.     Extraocular Movements: Extraocular movements intact.     Conjunctiva/sclera: Conjunctivae normal.  Cardiovascular:      Rate and Rhythm: Normal rate and regular rhythm.     Pulses: Normal pulses.     Heart sounds: Normal heart sounds. No murmur heard.    No friction rub. No gallop.  Pulmonary:     Effort: Pulmonary effort is normal. No respiratory distress.     Breath sounds: Normal breath sounds. No stridor. No wheezing, rhonchi or rales.  Chest:     Chest wall: No tenderness.  Abdominal:     General: There is no distension.     Palpations: Abdomen is soft.     Tenderness: There is no abdominal tenderness. There is no right CVA tenderness, left CVA tenderness or guarding.  Musculoskeletal:  General: No swelling, tenderness, deformity or signs of injury.     Right lower leg: No edema.     Left lower leg: No edema.  Skin:    General: Skin is warm and dry.     Capillary Refill: Capillary refill takes less than 2 seconds.     Coloration: Skin is not jaundiced or pale.     Findings: Lesion present. No bruising or erythema.     Comments: Lesions consistent with tinea pedis noted between toes.  No redness or swelling noted Calluses noted bilaterally around the big toes  Neurological:     Mental Status: He is alert and oriented to person, place, and time.     Motor: No weakness.     Coordination: Coordination normal.     Gait: Gait normal.  Psychiatric:        Mood and Affect: Mood normal.        Behavior: Behavior normal.        Thought Content: Thought content normal.        Judgment: Judgment normal.     BP 114/80   Pulse 65   Temp (!) 97.3 F (36.3 C)   Ht 5\' 8"  (1.727 m)   Wt 201 lb (91.2 kg)   SpO2 98%   BMI 30.56 kg/m  Wt Readings from Last 3 Encounters:  05/26/23 201 lb (91.2 kg)  04/21/23 204 lb (92.5 kg)  04/04/23 204 lb (92.5 kg)    No results found for: "TSH" Lab Results  Component Value Date   WBC 4.9 09/01/2021   HGB 13.5 09/01/2021   HCT 40.8 09/01/2021   MCV 83 09/01/2021   PLT 210 09/01/2021   Lab Results  Component Value Date   NA 139 04/21/2023   K  4.2 04/21/2023   CO2 22 04/21/2023   GLUCOSE 245 (H) 04/21/2023   BUN 16 04/21/2023   CREATININE 0.97 04/21/2023   BILITOT 0.4 04/21/2023   ALKPHOS 64 04/21/2023   AST 17 04/21/2023   ALT 27 04/21/2023   PROT 7.4 04/21/2023   ALBUMIN 4.5 04/21/2023   CALCIUM 9.5 04/21/2023   ANIONGAP 12 08/09/2020   EGFR 102 04/21/2023   Lab Results  Component Value Date   CHOL 188 04/21/2023   Lab Results  Component Value Date   HDL 36 (L) 04/21/2023   Lab Results  Component Value Date   LDLCALC 118 (H) 04/21/2023   Lab Results  Component Value Date   TRIG 191 (H) 04/21/2023   Lab Results  Component Value Date   CHOLHDL 5.2 (H) 04/21/2023   Lab Results  Component Value Date   HGBA1C 11.5 (A) 04/21/2023      Assessment & Plan:   Problem List Items Addressed This Visit       Cardiovascular and Mediastinum   High blood pressure    BP Readings from Last 3 Encounters:  05/26/23 114/80  04/21/23 116/61  04/04/23 122/84  Currently well-controlled without medication, BP goal is less than 130/80 DASH diet advised, engage in regular moderate to vigorous exercise at least 150 minutes weekly as tolerated        Endocrine   Hyperlipidemia associated with type 2 diabetes mellitus (HCC) - Primary    Patient denies polyuria polyphagia polydipsia Currently on Trulicity 0.75 mg once weekly injection Metformin 500 mg twice daily instead of 1000 mg twice daily Stated his fasting blood sugar has been below 150s Stated that he has been following a low-carb modified diet drinking  more water and exercising more Will increase Trulicity to 1.5 mg once weekly, take metformin 1000 mg twice daily Taking rosuvastatin 5 mg daily for hyperlipidemia, checking lipid panel Patient counseled on low-carb modified diet Encouraged to engage in regular moderate to vigorous exercises at least 150 minutes weekly Referral for diabetic eye exam is pending Diabetic foot exam completed today, checking urine  microalbumin labs Follow-up in 2 months      Relevant Medications   Dulaglutide (TRULICITY) 1.5 MG/0.5ML SOAJ   Other Relevant Orders   Microalbumin/Creatinine Ratio, Urine   Lipid panel   Ambulatory referral to Podiatry   Ambulatory referral to diabetic education     Musculoskeletal and Integument   Tinea pedis of both feet    Start terbinafine 1% cream apply to affected site twice daily Encouraged to wear cotton socks, wash between his toes, keep feet clean and dry daily.  Wear shoes that allow feet to get air      Relevant Medications   terbinafine (LAMISIL) 1 % cream   Callus of foot     No redness or swelling noted. calluses noted bilaterally around the big toes.  Wondering if he can have them removed patient referred to podiatry      Relevant Orders   Ambulatory referral to Podiatry    Meds ordered this encounter  Medications   Dulaglutide (TRULICITY) 1.5 MG/0.5ML SOAJ    Sig: Inject 1.5 mg into the skin once a week.    Dispense:  2 mL    Refill:  2   terbinafine (LAMISIL) 1 % cream    Sig: Apply 1 Application topically 2 (two) times daily.    Dispense:  30 g    Refill:  0    Follow-up: Return in about 2 months (around 07/26/2023) for DM, HYPERLIPIDEMIA.    Donell Beers, FNP

## 2023-05-27 LAB — LIPID PANEL
Chol/HDL Ratio: 4.3 ratio (ref 0.0–5.0)
Cholesterol, Total: 165 mg/dL (ref 100–199)
HDL: 38 mg/dL — ABNORMAL LOW (ref >39)
LDL Chol Calc (NIH): 111 mg/dL — ABNORMAL HIGH (ref 0–99)
Triglycerides: 85 mg/dL (ref 0–149)
VLDL Cholesterol Cal: 16 mg/dL (ref 5–40)

## 2023-05-27 LAB — MICROALBUMIN / CREATININE URINE RATIO
Creatinine, Urine: 214.7 mg/dL
Microalb/Creat Ratio: 5 mg/g{creat} (ref 0–29)
Microalbumin, Urine: 10.4 ug/mL

## 2023-05-29 ENCOUNTER — Other Ambulatory Visit: Payer: Self-pay | Admitting: Nurse Practitioner

## 2023-05-29 ENCOUNTER — Other Ambulatory Visit (HOSPITAL_COMMUNITY): Payer: Self-pay

## 2023-05-29 DIAGNOSIS — E1169 Type 2 diabetes mellitus with other specified complication: Secondary | ICD-10-CM

## 2023-05-29 MED ORDER — ROSUVASTATIN CALCIUM 10 MG PO TABS
10.0000 mg | ORAL_TABLET | Freq: Every day | ORAL | 1 refills | Status: DC
  Filled 2023-05-29 – 2023-06-08 (×2): qty 30, 30d supply, fill #0

## 2023-06-07 ENCOUNTER — Other Ambulatory Visit (HOSPITAL_COMMUNITY): Payer: Self-pay

## 2023-06-08 ENCOUNTER — Other Ambulatory Visit (HOSPITAL_COMMUNITY): Payer: Self-pay

## 2023-06-12 ENCOUNTER — Ambulatory Visit: Payer: 59 | Admitting: Dietician

## 2023-06-20 ENCOUNTER — Other Ambulatory Visit (HOSPITAL_COMMUNITY): Payer: Self-pay

## 2023-06-26 ENCOUNTER — Ambulatory Visit (HOSPITAL_COMMUNITY)
Admission: EM | Admit: 2023-06-26 | Discharge: 2023-06-26 | Disposition: A | Discharge status: Home / Self Care | Payer: 59 | Attending: Family Medicine | Admitting: Family Medicine

## 2023-06-26 ENCOUNTER — Encounter (HOSPITAL_COMMUNITY): Payer: Self-pay

## 2023-06-26 ENCOUNTER — Other Ambulatory Visit (HOSPITAL_COMMUNITY): Payer: Self-pay

## 2023-06-26 DIAGNOSIS — K047 Periapical abscess without sinus: Secondary | ICD-10-CM

## 2023-06-26 MED ORDER — KETOROLAC TROMETHAMINE 10 MG PO TABS
10.0000 mg | ORAL_TABLET | Freq: Four times a day (QID) | ORAL | 0 refills | Status: DC | PRN
  Filled 2023-06-26: qty 20, 5d supply, fill #0

## 2023-06-26 MED ORDER — KETOROLAC TROMETHAMINE 30 MG/ML IJ SOLN
INTRAMUSCULAR | Status: AC
  Filled 2023-06-26: qty 1

## 2023-06-26 MED ORDER — KETOROLAC TROMETHAMINE 30 MG/ML IJ SOLN
30.0000 mg | Freq: Once | INTRAMUSCULAR | Status: AC
Start: 2023-06-26 — End: 2023-06-26
  Administered 2023-06-26: 30 mg via INTRAMUSCULAR

## 2023-06-26 MED ORDER — AMOXICILLIN-POT CLAVULANATE 875-125 MG PO TABS
1.0000 | ORAL_TABLET | Freq: Two times a day (BID) | ORAL | 0 refills | Status: AC
  Filled 2023-06-26: qty 14, 7d supply, fill #0

## 2023-06-26 NOTE — ED Provider Notes (Signed)
[facility]    CSN: [id number] Arrival date & time: 06/26/23  4098      History   Chief Complaint Chief Complaint  Patient presents with   Dental Pain    HPI Tyrone Steele is a 37 y.o. male.    Dental Pain Here for pain in his right upper teeth.  Started suddenly about 2 in the morning this morning and awoke him.  Then worsened about 4 in the morning, about 5 hours ago, and he took some Tylenol which did not help.  He then went to the store and got some BC powder and applied it directly to where it was hurting, instead of taking it by mouth.  No fever or chills or congestion or cough  Has a history of diabetes and his sugars have been doing better.  Last EGFR was 102 in October of this year  He has nausea with statins but no other medication intolerances or allergies.  Past Medical History:  Diagnosis Date   Anemia    Diabetes mellitus without complication (HCC)    GERD without esophagitis 12/02/2020    Patient Active Problem List   Diagnosis Date Noted   Tinea pedis of both feet 05/26/2023   Callus of foot 05/26/2023   High blood pressure 04/21/2023   Cervical pain 04/21/2023   Acute right-sided low back pain without sciatica 04/21/2023   Neuropathy due to type 2 diabetes mellitus (HCC) 03/09/2021   GERD without esophagitis 12/02/2020   Hyperlipidemia associated with type 2 diabetes mellitus (HCC) 01/03/2019    Past Surgical History:  Procedure Laterality Date   NO PAST SURGERIES         Home Medications    Prior to Admission medications   Medication Sig Start Date End Date Taking? Authorizing Provider  amoxicillin-clavulanate (AUGMENTIN) 875-125 MG tablet Take 1 tablet by mouth 2 (two) times daily for 7 days. 06/26/23 07/03/23 Yes Tasheema Perrone, Janace Aris, MD  ketorolac (TORADOL) 10 MG tablet Take 1 tablet (10 mg total) by mouth every 6 (six) hours as needed (pain). 06/26/23  Yes Zenia Resides, MD  Blood Glucose Monitoring Suppl (BLOOD  GLUCOSE MONITOR SYSTEM) w/Device KIT Test blood sugar in the morning and at bedtime. 04/21/23   Paseda, Baird Kay, FNP  Dulaglutide (TRULICITY) 1.5 MG/0.5ML SOAJ Inject 1.5 mg into the skin once a week. 05/26/23   Paseda, Baird Kay, FNP  fluticasone (FLONASE) 50 MCG/ACT nasal spray Place 1 spray into both nostrils daily. Patient not taking: Reported on 04/21/2023 11/01/21 12/01/21  Madelyn Brunner, DO  gabapentin (NEURONTIN) 100 MG capsule Take 3 capsules (300 mg total) by mouth 2 (two) times daily. Patient not taking: Reported on 04/21/2023 08/11/21   Storm Frisk, MD  Glucose Blood (BLOOD GLUCOSE TEST STRIPS) STRP Test blood sugar 2 (two) times daily. 04/21/23 08/02/23  Donell Beers, FNP  metFORMIN (GLUCOPHAGE-XR) 500 MG 24 hr tablet Take 2 tablets (1,000 mg total) by mouth 2 (two) times daily with a meal. 04/21/23   Paseda, Baird Kay, FNP  pantoprazole (PROTONIX) 40 MG tablet Take 1 tablet (40 mg total) by mouth daily. Patient not taking: Reported on 04/21/2023 08/11/21   Storm Frisk, MD  rosuvastatin (CRESTOR) 10 MG tablet Take 1 tablet (10 mg total) by mouth daily. 05/29/23 05/28/24  Donell Beers, FNP  terbinafine (LAMISIL) 1 % cream Apply 1 Application topically 2 (two) times daily. 05/26/23   Donell Beers, FNP  TRUEplus Lancets 28G MISC Use to measure  blood sugar twice a day 05/11/21   Storm Frisk, MD    Family History Family History  Problem Relation Age of Onset   Diabetes Mother    Hypertension Father    Diabetes Father    Stroke Cousin     Social History Social History   Tobacco Use   Smoking status: Never   Smokeless tobacco: Never  Vaping Use   Vaping status: Never Used  Substance Use Topics   Alcohol use: No   Drug use: No     Allergies   Statins   Review of Systems Review of Systems   Physical Exam Triage Vital Signs ED Triage Vitals  Encounter Vitals Group     BP 06/26/23 0838 118/81     Systolic BP Percentile --       Diastolic BP Percentile --      Pulse Rate 06/26/23 0838 65     Resp 06/26/23 0838 18     Temp 06/26/23 0838 98.2 F (36.8 C)     Temp Source 06/26/23 0838 Oral     SpO2 06/26/23 0838 94 %     Weight 06/26/23 0836 210 lb (95.3 kg)     Height 06/26/23 0836 5\' 8"  (1.727 m)     Head Circumference --      Peak Flow --      Pain Score 06/26/23 0836 9     Pain Loc --      Pain Education --      Exclude from Growth Chart --    No data found.  Updated Vital Signs BP 118/81 (BP Location: Left Arm)   Pulse 65   Temp 98.2 F (36.8 C) (Oral)   Resp 18   Ht 5\' 8"  (1.727 m)   Wt 95.3 kg   SpO2 94%   BMI 31.93 kg/m   Visual Acuity Right Eye Distance:   Left Eye Distance:   Bilateral Distance:    Right Eye Near:   Left Eye Near:    Bilateral Near:     Physical Exam Vitals reviewed.  Constitutional:      General: He is not in acute distress.    Appearance: He is not ill-appearing, toxic-appearing or diaphoretic.  HENT:     Mouth/Throat:     Mouth: Mucous membranes are moist.     Comments: In his right upper gumline there is a broken tooth, #3.  No swelling or erythema there. Skin:    Coloration: Skin is not jaundiced or pale.  Neurological:     General: No focal deficit present.     Mental Status: He is alert and oriented to person, place, and time.  Psychiatric:        Behavior: Behavior normal.      UC Treatments / Results  Labs (all labs ordered are listed, but only abnormal results are displayed) Labs Reviewed - No data to display  EKG   Radiology No results found.  Procedures Procedures (including critical care time)  Medications Ordered in UC Medications  ketorolac (TORADOL) 30 MG/ML injection 30 mg (has no administration in time range)    Initial Impression / Assessment and Plan / UC Course  I have reviewed the triage vital signs and the nursing notes.  Pertinent labs & imaging results that were available during my care of the patient were  reviewed by me and considered in my medical decision making (see chart for details).     Toradol injection is given here and Toradol  tablets are sent to the pharmacy for pain.  Augmentin is sent for dental infection  Final Clinical Impressions(s) / UC Diagnoses   Final diagnoses:  Dental infection     Discharge Instructions      You have been given a shot of Toradol 30 mg today.  Ketorolac 10 mg tablets--take 1 tablet every 6 hours as needed for pain.  This is the same medicine that is in the shot we just gave you  Take amoxicillin-clavulanate 875 mg--1 tab twice daily with food for 7 days; this is the antibiotic  Please followup with a dentist about this issue      ED Prescriptions     Medication Sig Dispense Auth. Provider   ketorolac (TORADOL) 10 MG tablet Take 1 tablet (10 mg total) by mouth every 6 (six) hours as needed (pain). 20 tablet Zenia Resides, MD   amoxicillin-clavulanate (AUGMENTIN) 875-125 MG tablet Take 1 tablet by mouth 2 (two) times daily for 7 days. 14 tablet Lanice Folden, Janace Aris, MD      PDMP not reviewed this encounter.   Zenia Resides, MD 06/26/23 [id number]

## 2023-06-26 NOTE — Discharge Instructions (Signed)
You have been given a shot of Toradol 30 mg today.  Ketorolac 10 mg tablets--take 1 tablet every 6 hours as needed for pain.  This is the same medicine that is in the shot we just gave you  Take amoxicillin-clavulanate 875 mg--1 tab twice daily with food for 7 days; this is the antibiotic  Please followup with a dentist about this issue

## 2023-06-26 NOTE — ED Triage Notes (Signed)
Pt presents with dental pain that began this AM @ 0200. Pt reports his dental pain is primarily on his right side and radiates up. Pt currently rates his pain a 9/10. Pt states he applied "BC powder" to area and took a Tylenol with no improvement in his pain.

## 2023-07-26 ENCOUNTER — Encounter: Payer: Self-pay | Admitting: Nurse Practitioner

## 2023-07-26 ENCOUNTER — Ambulatory Visit (INDEPENDENT_AMBULATORY_CARE_PROVIDER_SITE_OTHER): Payer: 59 | Admitting: Nurse Practitioner

## 2023-07-26 ENCOUNTER — Other Ambulatory Visit (HOSPITAL_COMMUNITY): Payer: Self-pay

## 2023-07-26 VITALS — BP 113/75 | HR 81 | Temp 97.1°F | Wt 208.0 lb

## 2023-07-26 DIAGNOSIS — E785 Hyperlipidemia, unspecified: Secondary | ICD-10-CM | POA: Diagnosis not present

## 2023-07-26 DIAGNOSIS — Z20828 Contact with and (suspected) exposure to other viral communicable diseases: Secondary | ICD-10-CM | POA: Insufficient documentation

## 2023-07-26 DIAGNOSIS — E1169 Type 2 diabetes mellitus with other specified complication: Secondary | ICD-10-CM

## 2023-07-26 DIAGNOSIS — Z23 Encounter for immunization: Secondary | ICD-10-CM | POA: Insufficient documentation

## 2023-07-26 LAB — POCT GLYCOSYLATED HEMOGLOBIN (HGB A1C): Hemoglobin A1C: 7.8 % — AB (ref 4.0–5.6)

## 2023-07-26 MED ORDER — OSELTAMIVIR PHOSPHATE 75 MG PO CAPS
75.0000 mg | ORAL_CAPSULE | Freq: Every day | ORAL | 0 refills | Status: AC
  Filled 2023-07-26: qty 7, 7d supply, fill #0

## 2023-07-26 MED ORDER — TRULICITY 3 MG/0.5ML ~~LOC~~ SOAJ
3.0000 mg | SUBCUTANEOUS | 1 refills | Status: DC
  Filled 2023-07-26: qty 2, 28d supply, fill #0
  Filled 2023-12-26: qty 2, 28d supply, fill #1

## 2023-07-26 MED ORDER — EZETIMIBE 10 MG PO TABS
10.0000 mg | ORAL_TABLET | Freq: Every day | ORAL | 1 refills | Status: DC
  Filled 2023-07-26: qty 30, 30d supply, fill #0
  Filled 2023-12-26: qty 30, 30d supply, fill #1

## 2023-07-26 NOTE — Patient Instructions (Signed)
 Goal for fasting blood sugar ranges from 80 to 120 and 2 hours after any meal or at bedtime should be between 130 to 170.   When you start taking Trulicity  3 mg once weekly injection please decrease metformin  to 1000 mg daily.  I have sent in a prescription for a new cholesterol medication called Zetia  10 mg, please take medication daily    1. Hyperlipidemia associated with type 2 diabetes mellitus (HCC) (Primary)  - POCT glycosylated hemoglobin (Hb A1C) - Dulaglutide  (TRULICITY ) 3 MG/0.5ML SOAJ; Inject 3 mg as directed once a week.  Dispense: 2 mL; Refill: 1 - ezetimibe  (ZETIA ) 10 MG tablet; Take 1 tablet (10 mg total) by mouth daily.  Dispense: 90 tablet; Refill: 1  2. Need for influenza vaccination   It is important that you exercise regularly at least 30 minutes 5 times a week as tolerated  Think about what you will eat, plan ahead. Choose  clean, green, fresh or frozen over canned, processed or packaged foods which are more sugary, salty and fatty. 70 to 75% of food eaten should be vegetables and fruit. Three meals at set times with snacks allowed between meals, but they must be fruit or vegetables. Aim to eat over a 12 hour period , example 7 am to 7 pm, and STOP after  your last meal of the day. Drink water,generally about 64 ounces per day, no other drink is as healthy. Fruit juice is best enjoyed in a healthy way, by EATING the fruit.  Thanks for choosing [facility] we consider it a privelige to serve you.

## 2023-07-26 NOTE — Assessment & Plan Note (Addendum)
-   oseltamivir (TAMIFLU) 75 MG capsule; Take 1 capsule (75 mg total) by mouth daily for 7 days.  Dispense: 7 capsule; Refill: 0  Flu vaccine not given today due to recent exposure to the flu.

## 2023-07-26 NOTE — Assessment & Plan Note (Signed)
 Lab Results  Component Value Date   HGBA1C 7.8 (A) 07/26/2023  Will increase Trulicity  to 3 mg once weekly and decrease metformin  to 500 mg twice daily once he runs out of the Trulicity  1.5 mg dose that he still has at home. Zetia  10 mg daily ordered for hyperlipidemia Patient congratulated on his efforts at getting his diabetes under control Encouraged to continue low-carb diet Engage in regular moderate exercise at least 150 minutes weekly Encouraged to reschedule his diabetic eye exam Follow-up in 3 months

## 2023-07-26 NOTE — Progress Notes (Signed)
 Established Patient Office Visit  Subjective:  Patient ID: Tyrone Steele, male    DOB: 12-10-1985  Age: 38 y.o. MRN: [id number]  CC:  Chief Complaint  Patient presents with   Diabetes   Hyperlipidemia    HPI MARSDEN Tyrone Steele is a 38 y.o. male  has a past medical history of Anemia, Diabetes mellitus without complication (HCC), GERD without esophagitis (12/02/2020), and Hyperlipidemia.  Patient presents for follow-up for his chronic medical conditions  Type 2 diabetes.  Currently on metformin  1000 mg twice daily, Trulicity  1.5 mg once weekly injection, stated that he has been following a low-carb diet and exercising more.  Patient stated that he has not been taking Crestor  because he had upset stomach after taking the medication.  Missed his last appointment with the eye doctor plans to reschedule the appointment.   Patient's daughter was diagnosed with the flu today patient is currently asymptomatic and would like to have postexposure prophylaxis for flu.  He has not had the flu vaccine this season.     Past Medical History:  Diagnosis Date   Anemia    Diabetes mellitus without complication (HCC)    GERD without esophagitis 12/02/2020   Hyperlipidemia     Past Surgical History:  Procedure Laterality Date   NO PAST SURGERIES      Family History  Problem Relation Age of Onset   Diabetes Mother    Hypertension Father    Diabetes Father    Stroke Cousin     Social History   Socioeconomic History   Marital status: Single    Spouse name: Not on file   Number of children: 8   Years of education: Not on file   Highest education level: GED or equivalent  Occupational History   Not on file  Tobacco Use   Smoking status: Never   Smokeless tobacco: Never  Vaping Use   Vaping status: Never Used  Substance and Sexual Activity   Alcohol use: No   Drug use: No   Sexual activity: Yes  Other Topics Concern   Not on file  Social History Narrative   Lives with his  fiance   Social Drivers of Health   Financial Resource Strain: Medium Risk (05/26/2023)   Overall Financial Resource Strain (CARDIA)    Difficulty of Paying Living Expenses: Somewhat hard  Food Insecurity: Food Insecurity Present (05/26/2023)   Hunger Vital Sign    Worried About Running Out of Food in the Last Year: Never true    Ran Out of Food in the Last Year: Sometimes true  Transportation Needs: Unmet Transportation Needs (05/26/2023)   PRAPARE - Administrator, Civil Service (Medical): Yes    Lack of Transportation (Non-Medical): Yes  Physical Activity: Insufficiently Active (05/26/2023)   Exercise Vital Sign    Days of Exercise per Week: 5 days    Minutes of Exercise per Session: 10 min  Stress: Not on file  Social Connections: Moderately Isolated (05/26/2023)   Social Connection and Isolation Panel [NHANES]    Frequency of Communication with Friends and Family: Never    Frequency of Social Gatherings with Friends and Family: Once a week    Attends Religious Services: 1 to 4 times per year    Active Member of [employer] or Organizations: No    Attends Engineer, Structural: Not on file    Marital Status: Living with partner  Intimate Partner Violence: Not on file    Outpatient Medications Prior to  Visit  Medication Sig Dispense Refill   Blood Glucose Monitoring Suppl (BLOOD GLUCOSE MONITOR SYSTEM) w/Device KIT Test blood sugar in the morning and at bedtime. 1 kit 0   Glucose Blood (BLOOD GLUCOSE TEST STRIPS) STRP Test blood sugar 2 (two) times daily. 200 strip 4   metFORMIN  (GLUCOPHAGE -XR) 500 MG 24 hr tablet Take 2 tablets (1,000 mg total) by mouth 2 (two) times daily with a meal. 180 tablet 3   TRUEplus Lancets 28G MISC Use to measure blood sugar twice a day 100 each 1   Dulaglutide  (TRULICITY ) 1.5 MG/0.5ML SOAJ Inject 1.5 mg into the skin once a week. 2 mL 2   fluticasone  (FLONASE ) 50 MCG/ACT nasal spray Place 1 spray into both nostrils daily. (Patient not  taking: Reported on 07/26/2023) 16 g 0   gabapentin  (NEURONTIN ) 100 MG capsule Take 3 capsules (300 mg total) by mouth 2 (two) times daily. (Patient not taking: Reported on 07/26/2023) 60 capsule 1   pantoprazole  (PROTONIX ) 40 MG tablet Take 1 tablet (40 mg total) by mouth daily. (Patient not taking: Reported on 07/26/2023) 30 tablet 3   ketorolac  (TORADOL ) 10 MG tablet Take 1 tablet (10 mg total) by mouth every 6 (six) hours as needed (pain). (Patient not taking: Reported on 07/26/2023) 20 tablet 0   rosuvastatin  (CRESTOR ) 10 MG tablet Take 1 tablet (10 mg total) by mouth daily. (Patient not taking: Reported on 07/26/2023) 60 tablet 1   terbinafine  (LAMISIL ) 1 % cream Apply 1 Application topically 2 (two) times daily. (Patient not taking: Reported on 07/26/2023) 30 g 0   No facility-administered medications prior to visit.    Allergies  Allergen Reactions   Statins Nausea Only and Other (See Comments)    Severe fatigue    ROS Review of Systems  Constitutional:  Negative for appetite change, chills, fatigue and fever.  HENT:  Negative for congestion, postnasal drip, rhinorrhea and sneezing.   Respiratory:  Negative for cough, shortness of breath and wheezing.   Cardiovascular:  Negative for chest pain, palpitations and leg swelling.  Gastrointestinal:  Negative for abdominal pain, constipation, nausea and vomiting.  Genitourinary:  Negative for difficulty urinating, dysuria, flank pain and frequency.  Musculoskeletal:  Negative for arthralgias, back pain, joint swelling and myalgias.  Skin:  Negative for color change, pallor, rash and wound.  Neurological:  Negative for dizziness, facial asymmetry, weakness, numbness and headaches.  Psychiatric/Behavioral:  Negative for behavioral problems, confusion, self-injury and suicidal ideas.       Objective:    Physical Exam Vitals and nursing note reviewed.  Constitutional:      General: He is not in acute distress.    Appearance: Normal appearance.  He is not ill-appearing, toxic-appearing or diaphoretic.  HENT:     Mouth/Throat:     Mouth: Mucous membranes are moist.     Pharynx: Oropharynx is clear. No oropharyngeal exudate or posterior oropharyngeal erythema.  Eyes:     General: No scleral icterus.       Right eye: No discharge.        Left eye: No discharge.     Extraocular Movements: Extraocular movements intact.     Conjunctiva/sclera: Conjunctivae normal.  Cardiovascular:     Rate and Rhythm: Normal rate and regular rhythm.     Pulses: Normal pulses.     Heart sounds: Normal heart sounds. No murmur heard.    No friction rub. No gallop.  Pulmonary:     Effort: Pulmonary effort is normal. No respiratory distress.  Breath sounds: Normal breath sounds. No stridor. No wheezing, rhonchi or rales.  Chest:     Chest wall: No tenderness.  Abdominal:     General: There is no distension.     Palpations: Abdomen is soft.     Tenderness: There is no abdominal tenderness. There is no right CVA tenderness, left CVA tenderness or guarding.  Musculoskeletal:        General: No swelling, tenderness, deformity or signs of injury.     Right lower leg: No edema.     Left lower leg: No edema.  Skin:    General: Skin is warm and dry.     Capillary Refill: Capillary refill takes less than 2 seconds.     Coloration: Skin is not jaundiced or pale.     Findings: No bruising, erythema or lesion.  Neurological:     Mental Status: He is alert and oriented to person, place, and time.     Motor: No weakness.     Coordination: Coordination normal.     Gait: Gait normal.  Psychiatric:        Mood and Affect: Mood normal.        Behavior: Behavior normal.        Thought Content: Thought content normal.        Judgment: Judgment normal.     BP 113/75   Pulse 81   Temp (!) 97.1 F (36.2 C)   Wt 208 lb (94.3 kg)   SpO2 97%   BMI 31.63 kg/m  Wt Readings from Last 3 Encounters:  07/26/23 208 lb (94.3 kg)  06/26/23 210 lb (95.3 kg)   05/26/23 201 lb (91.2 kg)    No results found for: TSH Lab Results  Component Value Date   WBC 4.9 09/01/2021   HGB 13.5 09/01/2021   HCT 40.8 09/01/2021   MCV 83 09/01/2021   PLT 210 09/01/2021   Lab Results  Component Value Date   NA 139 04/21/2023   K 4.2 04/21/2023   CO2 22 04/21/2023   GLUCOSE 245 (H) 04/21/2023   BUN 16 04/21/2023   CREATININE 0.97 04/21/2023   BILITOT 0.4 04/21/2023   ALKPHOS 64 04/21/2023   AST 17 04/21/2023   ALT 27 04/21/2023   PROT 7.4 04/21/2023   ALBUMIN 4.5 04/21/2023   CALCIUM  9.5 04/21/2023   ANIONGAP 12 08/09/2020   EGFR 102 04/21/2023   Lab Results  Component Value Date   CHOL 165 05/26/2023   Lab Results  Component Value Date   HDL 38 (L) 05/26/2023   Lab Results  Component Value Date   LDLCALC 111 (H) 05/26/2023   Lab Results  Component Value Date   TRIG 85 05/26/2023   Lab Results  Component Value Date   CHOLHDL 4.3 05/26/2023   Lab Results  Component Value Date   HGBA1C 7.8 (A) 07/26/2023      Assessment & Plan:   Problem List Items Addressed This Visit       Endocrine   Hyperlipidemia associated with type 2 diabetes mellitus (HCC) - Primary   Lab Results  Component Value Date   HGBA1C 7.8 (A) 07/26/2023  Will increase Trulicity  to 3 mg once weekly and decrease metformin  to 500 mg twice daily once he runs out of the Trulicity  1.5 mg dose that he still has at home. Zetia  10 mg daily ordered for hyperlipidemia Patient congratulated on his efforts at getting his diabetes under control Encouraged to continue low-carb diet Engage in regular  moderate exercise at least 150 minutes weekly Encouraged to reschedule his diabetic eye exam Follow-up in 3 months       Relevant Medications   Dulaglutide  (TRULICITY ) 3 MG/0.5ML SOAJ   ezetimibe  (ZETIA ) 10 MG tablet   Other Relevant Orders   POCT glycosylated hemoglobin (Hb A1C) (Completed)     Other   Exposure to the flu    - oseltamivir  (TAMIFLU ) 75 MG  capsule; Take 1 capsule (75 mg total) by mouth daily for 7 days.  Dispense: 7 capsule; Refill: 0  Flu vaccine not given today due to recent exposure to the flu.      Relevant Medications   oseltamivir  (TAMIFLU ) 75 MG capsule    Meds ordered this encounter  Medications   Dulaglutide  (TRULICITY ) 3 MG/0.5ML SOAJ    Sig: Inject 3 mg as directed once a week.    Dispense:  2 mL    Refill:  1   ezetimibe  (ZETIA ) 10 MG tablet    Sig: Take 1 tablet (10 mg total) by mouth daily.    Dispense:  90 tablet    Refill:  1   oseltamivir  (TAMIFLU ) 75 MG capsule    Sig: Take 1 capsule (75 mg total) by mouth daily for 7 days.    Dispense:  7 capsule    Refill:  0    Follow-up: Return in about 3 months (around 10/24/2023) for HYPERLIPIDEMIA, DM.    Deonte Otting R Isam Unrein, FNP

## 2023-08-31 ENCOUNTER — Other Ambulatory Visit: Payer: Self-pay

## 2023-08-31 ENCOUNTER — Encounter (HOSPITAL_COMMUNITY): Payer: Self-pay | Admitting: *Deleted

## 2023-08-31 ENCOUNTER — Ambulatory Visit (HOSPITAL_COMMUNITY)
Admission: EM | Admit: 2023-08-31 | Discharge: 2023-08-31 | Disposition: A | Discharge status: Home / Self Care | Payer: 59 | Attending: Emergency Medicine | Admitting: Emergency Medicine

## 2023-08-31 ENCOUNTER — Other Ambulatory Visit (HOSPITAL_COMMUNITY): Payer: Self-pay

## 2023-08-31 DIAGNOSIS — R197 Diarrhea, unspecified: Secondary | ICD-10-CM

## 2023-08-31 DIAGNOSIS — R52 Pain, unspecified: Secondary | ICD-10-CM | POA: Diagnosis not present

## 2023-08-31 DIAGNOSIS — R112 Nausea with vomiting, unspecified: Secondary | ICD-10-CM

## 2023-08-31 MED ORDER — ONDANSETRON HCL 4 MG PO TABS
4.0000 mg | ORAL_TABLET | Freq: Four times a day (QID) | ORAL | 0 refills | Status: DC
  Filled 2023-08-31: qty 12, 3d supply, fill #0

## 2023-08-31 MED ORDER — OSELTAMIVIR PHOSPHATE 75 MG PO CAPS
75.0000 mg | ORAL_CAPSULE | Freq: Two times a day (BID) | ORAL | 0 refills | Status: DC
  Filled 2023-08-31: qty 10, 5d supply, fill #0

## 2023-08-31 NOTE — ED Provider Notes (Signed)
[facility]    CSN: [id number] Arrival date & time: 08/31/23  0818      History   Chief Complaint Chief Complaint  Patient presents with   Emesis   Diarrhea   Dizziness    HPI Tyrone Steele is a 38 y.o. male.   Patient presents today with emesis x 2 loose bowels dizziness intermittently.  Patient states his son was diagnosed with flu a 2 days ago he began to have symptoms yesterday.  Patient denies any chest pain no shortness of breath no cough.  Does have some intermittent body aches.  Has not taken anything prior to arrival    Past Medical History:  Diagnosis Date   Anemia    Diabetes mellitus without complication (HCC)    GERD without esophagitis 12/02/2020   Hyperlipidemia     Patient Active Problem List   Diagnosis Date Noted   Need for influenza vaccination 07/26/2023   Exposure to the flu 07/26/2023   Tinea pedis of both feet 05/26/2023   Callus of foot 05/26/2023   High blood pressure 04/21/2023   Cervical pain 04/21/2023   Acute right-sided low back pain without sciatica 04/21/2023   Neuropathy due to type 2 diabetes mellitus (HCC) 03/09/2021   GERD without esophagitis 12/02/2020   Hyperlipidemia associated with type 2 diabetes mellitus (HCC) 01/03/2019    Past Surgical History:  Procedure Laterality Date   NO PAST SURGERIES         Home Medications    Prior to Admission medications   Medication Sig Start Date End Date Taking? Authorizing Provider  Blood Glucose Monitoring Suppl (BLOOD GLUCOSE MONITOR SYSTEM) w/Device KIT Test blood sugar in the morning and at bedtime. 04/21/23  Yes Paseda, Baird Kay, FNP  Dulaglutide (TRULICITY) 3 MG/0.5ML SOAJ Inject 3 mg as directed once a week. 07/26/23  Yes Paseda, Baird Kay, FNP  ezetimibe (ZETIA) 10 MG tablet Take 1 tablet (10 mg total) by mouth daily. 07/26/23  Yes Paseda, Baird Kay, FNP  metFORMIN (GLUCOPHAGE-XR) 500 MG 24 hr tablet Take 2 tablets (1,000 mg total) by mouth 2 (two) times  daily with a meal. 04/21/23  Yes Paseda, Folashade R, FNP  ondansetron (ZOFRAN) 4 MG tablet Take 1 tablet (4 mg total) by mouth every 6 (six) hours. 08/31/23  Yes Coralyn Mark, NP  oseltamivir (TAMIFLU) 75 MG capsule Take 1 capsule (75 mg total) by mouth every 12 (twelve) hours. 08/31/23  Yes Coralyn Mark, NP  TRUEplus Lancets 28G MISC Use to measure blood sugar twice a day 05/11/21  Yes Storm Frisk, MD  fluticasone [facility]) 50 MCG/ACT nasal spray Place 1 spray into both nostrils daily. Patient not taking: Reported on 07/26/2023 11/01/21 12/01/21  Madelyn Brunner, DO  gabapentin (NEURONTIN) 100 MG capsule Take 3 capsules (300 mg total) by mouth 2 (two) times daily. Patient not taking: Reported on 07/26/2023 08/11/21   Storm Frisk, MD  pantoprazole (PROTONIX) 40 MG tablet Take 1 tablet (40 mg total) by mouth daily. Patient not taking: Reported on 07/26/2023 08/11/21   Storm Frisk, MD    Family History Family History  Problem Relation Age of Onset   Diabetes Mother    Hypertension Father    Diabetes Father    Stroke Cousin     Social History Social History   Tobacco Use   Smoking status: Never   Smokeless tobacco: Never  Vaping Use   Vaping status: Never Used  Substance Use Topics   Alcohol  use: No   Drug use: No     Allergies   Statins   Review of Systems Review of Systems  Constitutional:  Positive for appetite change, chills and fatigue.  HENT:  Negative for congestion, ear pain, postnasal drip, rhinorrhea, sinus pressure, sinus pain and sore throat.   Eyes: Negative.   Respiratory: Negative.    Cardiovascular: Negative.   Gastrointestinal:  Positive for diarrhea, nausea and vomiting. Negative for abdominal pain.  Genitourinary: Negative.   Musculoskeletal:        Generalized body aches  Skin: Negative.   Neurological:  Positive for dizziness. Negative for headaches.     Physical Exam Triage Vital Signs ED Triage Vitals  Encounter Vitals  Group     BP 08/31/23 1002 121/83     Systolic BP Percentile --      Diastolic BP Percentile --      Pulse Rate 08/31/23 1002 75     Resp 08/31/23 1002 18     Temp 08/31/23 1002 98.2 F (36.8 C)     Temp src --      SpO2 08/31/23 1002 95 %     Weight --      Height --      Head Circumference --      Peak Flow --      Pain Score 08/31/23 1000 0     Pain Loc --      Pain Education --      Exclude from Growth Chart --    No data found.  Updated Vital Signs BP 121/83   Pulse 75   Temp 98.2 F (36.8 C)   Resp 18   SpO2 95%   Visual Acuity Right Eye Distance:   Left Eye Distance:   Bilateral Distance:    Right Eye Near:   Left Eye Near:    Bilateral Near:     Physical Exam Constitutional:      Appearance: Normal appearance.  HENT:     Right Ear: Tympanic membrane normal.     Left Ear: Tympanic membrane normal.     Nose: Nose normal. No congestion or rhinorrhea.  Eyes:     Pupils: Pupils are equal, round, and reactive to light.  Cardiovascular:     Rate and Rhythm: Normal rate.  Pulmonary:     Effort: Pulmonary effort is normal.     Breath sounds: Normal breath sounds.  Abdominal:     General: Abdomen is flat. Bowel sounds are normal.     Tenderness: There is no abdominal tenderness.  Musculoskeletal:        General: Normal range of motion.  Skin:    General: Skin is warm.  Neurological:     General: No focal deficit present.     Mental Status: He is alert.      UC Treatments / Results  Labs (all labs ordered are listed, but only abnormal results are displayed) Labs Reviewed - No data to display  EKG   Radiology No results found.  Procedures Procedures (including critical care time)  Medications Ordered in UC Medications - No data to display  Initial Impression / Assessment and Plan / UC Course  I have reviewed the triage vital signs and the nursing notes.  Pertinent labs & imaging results that were available during my care of the patient  were reviewed by me and considered in my medical decision making (see chart for details).     Treating patient symptomatically for influenza Take nausea  medicine as needed Avoid spicy or greasy foods for the next 24 to 48 hours Take over-the-counter DayQuil NyQuil as needed Take Tylenol or Motrin as needed for body aches or fevers If symptoms become worse follow-up with your PCP or return to the urgent care Final Clinical Impressions(s) / UC Diagnoses   Final diagnoses:  Nausea and vomiting, unspecified vomiting type  Diarrhea, unspecified type  Generalized body aches     Discharge Instructions      Treating patient symptomatically for influenza Take nausea medicine as needed Avoid spicy or greasy foods for the next 24 to 48 hours Take over-the-counter DayQuil NyQuil as needed Take Tylenol or Motrin as needed for body aches or fevers If symptoms become worse follow-up with your PCP or return to the urgent care     ED Prescriptions     Medication Sig Dispense Auth. Provider   oseltamivir (TAMIFLU) 75 MG capsule Take 1 capsule (75 mg total) by mouth every 12 (twelve) hours. 10 capsule Maple Mirza L, NP   ondansetron (ZOFRAN) 4 MG tablet Take 1 tablet (4 mg total) by mouth every 6 (six) hours. 12 tablet Coralyn Mark, NP      PDMP not reviewed this encounter.   Coralyn Mark, NP 08/31/23 1039

## 2023-08-31 NOTE — Discharge Instructions (Signed)
Treating patient symptomatically for influenza Take nausea medicine as needed Avoid spicy or greasy foods for the next 24 to 48 hours Take over-the-counter DayQuil NyQuil as needed Take Tylenol or Motrin as needed for body aches or fevers If symptoms become worse follow-up with your PCP or return to the urgent care

## 2023-08-31 NOTE — ED Triage Notes (Signed)
PT's son was dx with Flu A on yesterday. Pt now has diarrhea ,vomiting . Pt sent home from work .

## 2023-09-12 ENCOUNTER — Other Ambulatory Visit (HOSPITAL_COMMUNITY): Payer: Self-pay

## 2023-10-05 ENCOUNTER — Other Ambulatory Visit: Payer: Self-pay | Admitting: Nurse Practitioner

## 2023-10-05 ENCOUNTER — Other Ambulatory Visit (HOSPITAL_COMMUNITY): Payer: Self-pay

## 2023-10-05 DIAGNOSIS — B353 Tinea pedis: Secondary | ICD-10-CM

## 2023-10-05 NOTE — Telephone Encounter (Signed)
 Please advise [facility]

## 2023-10-06 ENCOUNTER — Other Ambulatory Visit (HOSPITAL_COMMUNITY): Payer: Self-pay

## 2023-10-06 MED ORDER — TERBINAFINE HCL 1 % EX CREA
1.0000 | TOPICAL_CREAM | Freq: Two times a day (BID) | CUTANEOUS | 0 refills | Status: DC
  Filled 2023-10-06: qty 30, 15d supply, fill #0

## 2023-10-27 ENCOUNTER — Ambulatory Visit
Admission: EM | Admit: 2023-10-27 | Discharge: 2023-10-27 | Disposition: A | Discharge status: Home / Self Care | Payer: Self-pay | Attending: Family Medicine | Admitting: Family Medicine

## 2023-10-27 ENCOUNTER — Other Ambulatory Visit (HOSPITAL_COMMUNITY): Payer: Self-pay

## 2023-10-27 DIAGNOSIS — K047 Periapical abscess without sinus: Secondary | ICD-10-CM

## 2023-10-27 MED ORDER — IBUPROFEN 800 MG PO TABS
800.0000 mg | ORAL_TABLET | Freq: Three times a day (TID) | ORAL | 0 refills | Status: DC
  Filled 2023-10-27: qty 21, 7d supply, fill #0

## 2023-10-27 MED ORDER — AMOXICILLIN-POT CLAVULANATE 875-125 MG PO TABS
1.0000 | ORAL_TABLET | Freq: Two times a day (BID) | ORAL | 0 refills | Status: AC
  Filled 2023-10-27: qty 20, 10d supply, fill #0

## 2023-10-27 NOTE — Discharge Instructions (Signed)
[facility] in [place] Address: [address], [place], [place] [address] Phone: [phone]  [place] [phone] extension [phone] [address].  Dr. Donn Pierini [phone] [place] [phone] [address] [facility].  Rescue mission [phone] extension [phone] [address] [place], [place], [address] First come first serve for the first 10 clients.  May do simple extractions only, no wisdom teeth or surgery.  You may try the second for Thursday of the month starting at [place] [facility] [employer] You may call the school to see if they are still helping to provide dental care for emergent cases.

## 2023-10-27 NOTE — ED Provider Notes (Signed)
 [facility]  Note:  This document was prepared using Conservation officer, historic buildings and may include unintentional dictation errors.  MRN: [id number] DOB: [birth year]  Subjective:   Tyrone Steele is a 39 y.o. male presenting for 2-day history of moderate to severe left-sided facial pain with swelling around his molars and gum.  Patient does not have a dental specialist.  No fever, difficulty swallowing or drinking fluids.  No chest pain, nausea, vomiting, abdominal pain.  No current facility-administered medications for this encounter.  Current Outpatient Medications:    Blood Glucose Monitoring Suppl (BLOOD GLUCOSE MONITOR SYSTEM) w/Device KIT, Test blood sugar in the morning and at bedtime., Disp: 1 kit, Rfl: 0   Dulaglutide (TRULICITY) 3 MG/0.5ML SOAJ, Inject 3 mg as directed once a week., Disp: 2 mL, Rfl: 1   ezetimibe (ZETIA) 10 MG tablet, Take 1 tablet (10 mg total) by mouth daily., Disp: 90 tablet, Rfl: 1   fluticasone (FLONASE) 50 MCG/ACT nasal spray, Place 1 spray into both nostrils daily. (Patient not taking: Reported on 07/26/2023), Disp: 16 g, Rfl: 0   gabapentin (NEURONTIN) 100 MG capsule, Take 3 capsules (300 mg total) by mouth 2 (two) times daily. (Patient not taking: Reported on 07/26/2023), Disp: 60 capsule, Rfl: 1   metFORMIN (GLUCOPHAGE-XR) 500 MG 24 hr tablet, Take 2 tablets (1,000 mg total) by mouth 2 (two) times daily with a meal., Disp: 180 tablet, Rfl: 3   ondansetron (ZOFRAN) 4 MG tablet, Take 1 tablet (4 mg total) by mouth every 6 (six) hours., Disp: 12 tablet, Rfl: 0   oseltamivir (TAMIFLU) 75 MG capsule, Take 1 capsule (75 mg total) by mouth every 12 (twelve) hours. (Patient not taking: Reported on 10/27/2023), Disp: 10 capsule, Rfl: 0   pantoprazole (PROTONIX) 40 MG tablet, Take 1 tablet (40 mg total) by mouth daily. (Patient not taking: Reported on 07/26/2023), Disp: 30 tablet, Rfl: 3   terbinafine (FT ATHLETES FOOT, TERBINAFINE,) 1 % cream,  Apply 1 Application topically 2 (two) times daily., Disp: 30 g, Rfl: 0   TRUEplus Lancets 28G MISC, Use to measure blood sugar twice a day, Disp: 100 each, Rfl: 1   Allergies  Allergen Reactions   Statins Nausea Only and Other (See Comments)    Severe fatigue    Past Medical History:  Diagnosis Date   Anemia    Diabetes mellitus without complication (HCC)    GERD without esophagitis 12/02/2020   Hyperlipidemia      Past Surgical History:  Procedure Laterality Date   NO PAST SURGERIES      Family History  Problem Relation Age of Onset   Diabetes Mother    Hypertension Father    Diabetes Father    Stroke Cousin     Social History   Tobacco Use   Smoking status: Never   Smokeless tobacco: Never  Vaping Use   Vaping status: Never Used  Substance Use Topics   Alcohol use: No   Drug use: No    ROS   Objective:   Vitals: BP 123/79 (BP Location: Left Arm)   Temp 98.2 F (36.8 C) (Oral)   Resp 18   SpO2 96%   Physical Exam Constitutional:      General: He is not in acute distress.    Appearance: Normal appearance. He is well-developed and normal weight. He is not ill-appearing, toxic-appearing or diaphoretic.  HENT:     Head: Normocephalic and atraumatic.     Right Ear: External ear  normal.     Left Ear: External ear normal.     Nose: Nose normal.     Mouth/Throat:     Pharynx: Oropharynx is clear.   Eyes:     General: No scleral icterus.       Right eye: No discharge.        Left eye: No discharge.     Extraocular Movements: Extraocular movements intact.  Cardiovascular:     Rate and Rhythm: Normal rate.  Pulmonary:     Effort: Pulmonary effort is normal.  Musculoskeletal:     Cervical back: Normal range of motion.  Neurological:     Mental Status: He is alert and oriented to person, place, and time.  Psychiatric:        Mood and Affect: Mood normal.        Behavior: Behavior normal.        Thought Content: Thought content normal.         Judgment: Judgment normal.     Assessment and Plan :   PDMP not reviewed this encounter.  1. Dental abscess    Start Augmentin for dental infection/abscess, use ibuprofen for pain and inflammation. Emphasized need for dental surgeon consult. Counseled patient on potential for adverse effects with medications prescribed/recommended today, strict ER and return-to-clinic precautions discussed, patient verbalized understanding.    Wallis Bamberg, [place] 10/27/23 [phone]

## 2023-10-27 NOTE — ED Triage Notes (Signed)
 Pt c/o of swelling in the mouth x2days.Taking ibuprofen.

## 2023-10-30 ENCOUNTER — Ambulatory Visit: Payer: Self-pay | Admitting: Nurse Practitioner

## 2023-10-30 ENCOUNTER — Telehealth: Payer: Self-pay | Admitting: Nurse Practitioner

## 2023-10-30 NOTE — Telephone Encounter (Signed)
 Called about no show left vm

## 2023-11-09 ENCOUNTER — Ambulatory Visit (INDEPENDENT_AMBULATORY_CARE_PROVIDER_SITE_OTHER): Admitting: Podiatry

## 2023-11-09 DIAGNOSIS — Z91199 Patient's noncompliance with other medical treatment and regimen due to unspecified reason: Secondary | ICD-10-CM

## 2023-11-10 NOTE — Progress Notes (Signed)
 Patient was no-show for appointment today

## 2023-12-26 ENCOUNTER — Other Ambulatory Visit (HOSPITAL_COMMUNITY): Payer: Self-pay

## 2024-01-04 ENCOUNTER — Other Ambulatory Visit (HOSPITAL_COMMUNITY): Payer: Self-pay
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
## 2024-02-25 ENCOUNTER — Encounter (HOSPITAL_COMMUNITY): Payer: Self-pay

## 2024-02-25 ENCOUNTER — Ambulatory Visit (HOSPITAL_COMMUNITY)
Admission: EM | Admit: 2024-02-25 | Discharge: 2024-02-25 | Disposition: A | Discharge status: Home / Self Care | Payer: Self-pay | Attending: Physician Assistant | Admitting: Physician Assistant

## 2024-02-25 DIAGNOSIS — R051 Acute cough: Secondary | ICD-10-CM

## 2024-02-25 DIAGNOSIS — R0981 Nasal congestion: Secondary | ICD-10-CM

## 2024-02-25 DIAGNOSIS — K30 Functional dyspepsia: Secondary | ICD-10-CM

## 2024-02-25 DIAGNOSIS — B349 Viral infection, unspecified: Secondary | ICD-10-CM

## 2024-02-25 DIAGNOSIS — R197 Diarrhea, unspecified: Secondary | ICD-10-CM

## 2024-02-25 LAB — POC SARS CORONAVIRUS 2 AG -  ED: SARS Coronavirus 2 Ag: NEGATIVE

## 2024-02-25 MED ORDER — PROMETHAZINE-DM 6.25-15 MG/5ML PO SYRP
5.0000 mL | ORAL_SOLUTION | Freq: Every evening | ORAL | 0 refills | Status: DC | PRN
  Filled 2024-02-25: qty 25, 5d supply, fill #0

## 2024-02-25 MED ORDER — FAMOTIDINE 20 MG PO TABS
20.0000 mg | ORAL_TABLET | Freq: Every day | ORAL | 0 refills | Status: AC
  Filled 2024-02-25: qty 14, 14d supply, fill #0

## 2024-02-25 MED ORDER — ALUM & MAG HYDROXIDE-SIMETH 200-200-20 MG/5ML PO SUSP
30.0000 mL | Freq: Once | ORAL | Status: AC
  Administered 2024-02-25: 30 mL via ORAL

## 2024-02-25 MED ORDER — ALUM & MAG HYDROXIDE-SIMETH 200-200-20 MG/5ML PO SUSP
ORAL | Status: AC
  Filled 2024-02-25: qty 30

## 2024-02-25 NOTE — ED Triage Notes (Signed)
 Pt states that he has diarrhea, chills and nasal congestion. X1 day

## 2024-02-25 NOTE — Discharge Instructions (Signed)
 You tested negative for COVID.  I am glad that the medication made your stomach feel better.  Take Pepcid  20 mg at bedtime for the next week.  Eat a bland diet and avoid spicy/acidic/fatty foods.  Take Promethazine  DM at night.  This will make you sleepy so do not drive or drink alcohol with taking it.  You can use over-the-counter medications including Mucinex , Flonase , Tylenol /acetaminophen , nasal saline/sinus rinses for additional symptom relief.  Make sure you rest and drink plenty of fluid.  If your symptoms are not improving within a week or if you have any worsening symptoms including fever, worsening cough, shortness of breath, nausea/vomiting interfering with oral intake, severe abdominal pain, abdominal pain in 1 part of your abdomen, weakness you need to be seen immediately.

## 2024-02-25 NOTE — ED Provider Notes (Signed)
 [facility]    CSN: [id number] Arrival date & time: 02/25/24  1654      History   Chief Complaint Chief Complaint  Patient presents with   Diarrhea    Diarrhea, chills, and nasal congestion x1 day    HPI Tyrone Steele is a 38 y.o. male.   Patient presents today with a 24-hour history of viral symptoms including diarrhea, chills, nasal congestion, cough, acid reflux-like symptoms.  Denies any nausea, vomiting, measurable fever, chest pain, shortness of breath, melena, hematochezia.  He has not been taking any over-the-counter medication.  Denies any suspicious food intake, recent travel.  Does report that his children have been sick and diagnosed with a viral illness but does not know what they were ultimately diagnosed with.  He has not had COVID recently.  He does have a history of diabetes but denies any additional significant past medical history including asthma, COPD, smoking.  Denies any recent antibiotic or steroid use.    Past Medical History:  Diagnosis Date   Anemia    Diabetes mellitus without complication (HCC)    GERD without esophagitis 12/02/2020   Hyperlipidemia     Patient Active Problem List   Diagnosis Date Noted   Need for influenza vaccination 07/26/2023   Exposure to the flu 07/26/2023   Tinea pedis of both feet 05/26/2023   Callus of foot 05/26/2023   High blood pressure 04/21/2023   Cervical pain 04/21/2023   Acute right-sided low back pain without sciatica 04/21/2023   Neuropathy due to type 2 diabetes mellitus (HCC) 03/09/2021   GERD without esophagitis 12/02/2020   Hyperlipidemia associated with type 2 diabetes mellitus (HCC) 01/03/2019    Past Surgical History:  Procedure Laterality Date   NO PAST SURGERIES         Home Medications    Prior to Admission medications   Medication Sig Start Date End Date Taking? Authorizing Provider  Blood Glucose Monitoring Suppl (BLOOD GLUCOSE MONITOR SYSTEM) w/Device KIT Test blood  sugar in the morning and at bedtime. 04/21/23  Yes Paseda, Folashade R, FNP  Dulaglutide  (TRULICITY ) 3 MG/0.5ML SOAJ Inject 3 mg as directed once a week. 07/26/23  Yes Paseda, Folashade R, FNP  famotidine  (PEPCID ) 20 MG tablet Take 1 tablet (20 mg total) by mouth at bedtime. 02/25/24  Yes Abigal Choung, Rocky POUR, PA-C  metFORMIN  (GLUCOPHAGE -XR) 500 MG 24 hr tablet Take 2 tablets (1,000 mg total) by mouth 2 (two) times daily with a meal. 04/21/23  Yes Paseda, Folashade R, FNP  promethazine -dextromethorphan (PROMETHAZINE -DM) 6.25-15 MG/5ML syrup Take 5 mLs by mouth at bedtime as needed for cough. 02/25/24  Yes Madaleine Simmon, Rocky POUR, PA-C  TRUEplus Lancets 28G MISC Use to measure blood sugar twice a day 05/11/21  Yes Brien Belvie BRAVO, MD  fluticasone  (FLONASE ) 50 MCG/ACT nasal spray Place 1 spray into both nostrils daily. Patient not taking: Reported on 07/26/2023 11/01/21 12/01/21  Burnetta Brunet, DO    Family History Family History  Problem Relation Age of Onset   Diabetes Mother    Hypertension Father    Diabetes Father    Stroke Cousin     Social History Social History   Tobacco Use   Smoking status: Never   Smokeless tobacco: Never  Vaping Use   Vaping status: Never Used  Substance Use Topics   Alcohol use: No   Drug use: No     Allergies   Statins   Review of Systems Review of Systems  Constitutional:  Positive  for activity change, chills and fatigue. Negative for appetite change and fever.  HENT:  Positive for congestion and sore throat. Negative for sinus pressure and sneezing.   Respiratory:  Positive for cough. Negative for shortness of breath.   Cardiovascular:  Negative for chest pain.  Gastrointestinal:  Positive for abdominal pain and diarrhea. Negative for nausea and vomiting.     Physical Exam Triage Vital Signs ED Triage Vitals  Encounter Vitals Group     BP 02/25/24 1806 115/78     Girls Systolic BP Percentile --      Girls Diastolic BP Percentile --      Boys Systolic BP  Percentile --      Boys Diastolic BP Percentile --      Pulse Rate 02/25/24 1806 72     Resp 02/25/24 1806 18     Temp 02/25/24 1806 98.5 F (36.9 C)     Temp Source 02/25/24 1806 Oral     SpO2 02/25/24 1806 95 %     Weight 02/25/24 1804 210 lb (95.3 kg)     Height 02/25/24 1804 5' 9 (1.753 m)     Head Circumference --      Peak Flow --      Pain Score 02/25/24 1804 0     Pain Loc --      Pain Education --      Exclude from Growth Chart --    No data found.  Updated Vital Signs BP 115/78 (BP Location: Right Arm)   Pulse 72   Temp 98.5 F (36.9 C) (Oral)   Resp 18   Ht 5' 9 (1.753 m)   Wt 210 lb (95.3 kg)   SpO2 95%   BMI 31.01 kg/m   Visual Acuity Right Eye Distance:   Left Eye Distance:   Bilateral Distance:    Right Eye Near:   Left Eye Near:    Bilateral Near:     Physical Exam Vitals reviewed.  Constitutional:      General: He is awake.     Appearance: Normal appearance. He is well-developed. He is not ill-appearing.     Comments: Very pleasant male appears stated age in no acute distress sitting comfortably in exam room  HENT:     Head: Normocephalic and atraumatic.     Right Ear: Tympanic membrane, ear canal and external ear normal. Tympanic membrane is not erythematous or bulging.     Left Ear: Tympanic membrane, ear canal and external ear normal. Tympanic membrane is not erythematous or bulging.     Nose: Nose normal.     Mouth/Throat:     Pharynx: Uvula midline. Posterior oropharyngeal erythema and postnasal drip present. No oropharyngeal exudate or uvula swelling.  Cardiovascular:     Rate and Rhythm: Normal rate and regular rhythm.     Heart sounds: Normal heart sounds, S1 normal and S2 normal. No murmur heard. Pulmonary:     Effort: Pulmonary effort is normal. No accessory muscle usage or respiratory distress.     Breath sounds: Normal breath sounds. No stridor. No wheezing, rhonchi or rales.     Comments: Clear to auscultation  bilaterally Abdominal:     General: Bowel sounds are normal.     Palpations: Abdomen is soft.     Tenderness: There is no abdominal tenderness. There is no right CVA tenderness, left CVA tenderness, guarding or rebound.     Comments: Benign abdominal exam  Neurological:     Mental Status: He is  alert.  Psychiatric:        Behavior: Behavior is cooperative.      UC Treatments / Results  Labs (all labs ordered are listed, but only abnormal results are displayed) Labs Reviewed  POC SARS CORONAVIRUS 2 AG -  ED    EKG   Radiology No results found.  Procedures Procedures (including critical care time)  Medications Ordered in UC Medications  alum & mag hydroxide-simeth (MAALOX/MYLANTA) 200-200-20 MG/5ML suspension 30 mL (30 mLs Oral Given 02/25/24 1834)    Initial Impression / Assessment and Plan / UC Course  I have reviewed the triage vital signs and the nursing notes.  Pertinent labs & imaging results that were available during my care of the patient were reviewed by me and considered in my medical decision making (see chart for details).     Patient is well-appearing, afebrile, nontoxic, nontachycardic.  Suspect viral illness given clinical presentation and known sick contacts.  No evidence of acute infection on physical exam that would warrant initiation antibiotics.  He was given Maalox with improvement in symptoms suspect his GI upset is related to his viral illness.  Will start Pepcid  to help manage symptoms he was encouraged to eat a bland diet and drink plenty of fluid.  He was given Promethazine  DM to help cough.  We discussed that this can be sedating and he is not to drive or drink alcohol taking this medication.  No indication for dose adjustment based on metabolic panel from 04/21/2023 that showed creatinine of 0.97 and calculated creatinine clearance of 138 mL/min.  We discussed that if his symptoms are not improving within a week or if he has any worsening symptoms he  needs to be seen immediately.  Strict return precautions given.  Excuse note provided.  Final Clinical Impressions(s) / UC Diagnoses   Final diagnoses:  Nasal congestion  Viral illness  Diarrhea, unspecified type  Acute cough  Stomach upset     Discharge Instructions      You tested negative for COVID.  I am glad that the medication made your stomach feel better.  Take Pepcid  20 mg at bedtime for the next week.  Eat a bland diet and avoid spicy/acidic/fatty foods.  Take Promethazine  DM at night.  This will make you sleepy so do not drive or drink alcohol with taking it.  You can use over-the-counter medications including Mucinex , Flonase , Tylenol /acetaminophen , nasal saline/sinus rinses for additional symptom relief.  Make sure you rest and drink plenty of fluid.  If your symptoms are not improving within a week or if you have any worsening symptoms including fever, worsening cough, shortness of breath, nausea/vomiting interfering with oral intake, severe abdominal pain, abdominal pain in 1 part of your abdomen, weakness you need to be seen immediately.     ED Prescriptions     Medication Sig Dispense Auth. Provider   famotidine  (PEPCID ) 20 MG tablet Take 1 tablet (20 mg total) by mouth at bedtime. 14 tablet Zekiel Torian K, PA-C   promethazine -dextromethorphan (PROMETHAZINE -DM) 6.25-15 MG/5ML syrup Take 5 mLs by mouth at bedtime as needed for cough. 25 mL Lorance Pickeral K, PA-C      PDMP not reviewed this encounter.   Sherrell Rocky POUR, PA-C 02/25/24 1909

## 2024-02-26 ENCOUNTER — Other Ambulatory Visit (HOSPITAL_COMMUNITY): Payer: Self-pay

## 2024-03-06 ENCOUNTER — Other Ambulatory Visit (HOSPITAL_COMMUNITY): Payer: Self-pay

## 2024-03-07 ENCOUNTER — Other Ambulatory Visit (HOSPITAL_COMMUNITY): Payer: Self-pay

## 2024-03-07 ENCOUNTER — Ambulatory Visit
Admission: EM | Admit: 2024-03-07 | Discharge: 2024-03-07 | Disposition: A | Discharge status: Home / Self Care | Payer: Self-pay | Attending: Family Medicine | Admitting: Family Medicine

## 2024-03-07 DIAGNOSIS — K047 Periapical abscess without sinus: Secondary | ICD-10-CM

## 2024-03-07 MED ORDER — AMOXICILLIN-POT CLAVULANATE 875-125 MG PO TABS
1.0000 | ORAL_TABLET | Freq: Two times a day (BID) | ORAL | 0 refills | Status: DC
  Filled 2024-03-07: qty 20, 10d supply, fill #0

## 2024-03-07 MED ORDER — NAPROXEN 500 MG PO TABS
500.0000 mg | ORAL_TABLET | Freq: Two times a day (BID) | ORAL | 0 refills | Status: DC
  Filled 2024-03-07: qty 30, 15d supply, fill #0

## 2024-03-07 NOTE — Discharge Instructions (Signed)
Make sure you schedule an appointment with a dentist/dental surgeon as soon as possible.  You may try some of the resources below.    [facility] in [place], [place] Address: [address], [place], [place] [address] Phone: [phone]  [place] [phone] extension [phone] [address].  Dr. Donn Pierini [phone] [place] [phone] [address] [facility] [place].  Rescue mission [phone] extension [phone] [address], [place], [place], [address] First come first serve for the first 10 clients.  May do simple extractions only, no wisdom teeth or surgery.  You may try the second for Thursday of the month starting at [place] [facility] [employer] You may call the school to see if they are still helping to provide dental care for emergent cases.

## 2024-03-07 NOTE — ED Triage Notes (Signed)
 Pt c/o left upper dental pain x today-using oragel-NAD-steady gait

## 2024-03-07 NOTE — ED Provider Notes (Signed)
 [facility]  Note:  This document was prepared using Conservation officer, historic buildings and may include unintentional dictation errors.  MRN: [id number] DOB: May 18, 1986  Subjective:   Tyrone Steele is a 38 y.o. male presenting for acute onset today of severe left upper dental pain.  Patient does not have a dental specialist.  No fever, drainage of pus or bleeding.  Has used Orajel with minimal relief.   No current facility-administered medications for this encounter.  Current Outpatient Medications:    Blood Glucose Monitoring Suppl (BLOOD GLUCOSE MONITOR SYSTEM) w/Device KIT, Test blood sugar in the morning and at bedtime., Disp: 1 kit, Rfl: 0   Dulaglutide  (TRULICITY ) 3 MG/0.5ML SOAJ, Inject 3 mg as directed once a week., Disp: 2 mL, Rfl: 1   famotidine  (PEPCID ) 20 MG tablet, Take 1 tablet (20 mg total) by mouth at bedtime., Disp: 14 tablet, Rfl: 0   fluticasone  (FLONASE ) 50 MCG/ACT nasal spray, Place 1 spray into both nostrils daily. (Patient not taking: Reported on 07/26/2023), Disp: 16 g, Rfl: 0   metFORMIN  (GLUCOPHAGE -XR) 500 MG 24 hr tablet, Take 2 tablets (1,000 mg total) by mouth 2 (two) times daily with a meal., Disp: 180 tablet, Rfl: 3   promethazine -dextromethorphan (PROMETHAZINE -DM) 6.25-15 MG/5ML syrup, Take 5 mLs by mouth at bedtime as needed for cough., Disp: 25 mL, Rfl: 0   TRUEplus Lancets 28G MISC, Use to measure blood sugar twice a day, Disp: 100 each, Rfl: 1   Allergies  Allergen Reactions   Statins Nausea Only and Other (See Comments)    Severe fatigue    Past Medical History:  Diagnosis Date   Anemia    Diabetes mellitus without complication (HCC)    GERD without esophagitis 12/02/2020   Hyperlipidemia      Past Surgical History:  Procedure Laterality Date   NO PAST SURGERIES      Family History  Problem Relation Age of Onset   Diabetes Mother    Hypertension Father    Diabetes Father    Stroke Cousin     Social History    Tobacco Use   Smoking status: Never   Smokeless tobacco: Never  Vaping Use   Vaping status: Never Used  Substance Use Topics   Alcohol use: No   Drug use: No    ROS   Objective:   Vitals: BP 112/74 (BP Location: Right Arm)   Pulse 74   Temp 98.4 F (36.9 C) (Oral)   Resp 18   SpO2 96%   Physical Exam Constitutional:      General: He is not in acute distress.    Appearance: Normal appearance. He is well-developed and normal weight. He is not ill-appearing, toxic-appearing or diaphoretic.  HENT:     Head: Normocephalic and atraumatic.     Right Ear: External ear normal.     Left Ear: External ear normal.     Nose: Nose normal.     Mouth/Throat:     Pharynx: Oropharynx is clear.   Eyes:     General: No scleral icterus.       Right eye: No discharge.        Left eye: No discharge.     Extraocular Movements: Extraocular movements intact.  Cardiovascular:     Rate and Rhythm: Normal rate.  Pulmonary:     Effort: Pulmonary effort is normal.  Musculoskeletal:     Cervical back: Normal range of motion.  Neurological:     Mental Status:  He is alert and oriented to person, place, and time.  Psychiatric:        Mood and Affect: Mood normal.        Behavior: Behavior normal.        Thought Content: Thought content normal.        Judgment: Judgment normal.     Assessment and Plan :   PDMP not reviewed this encounter.  1. Dental abscess    Start Augmentin  for dental infection/abscess, use naproxen  for pain and inflammation. Emphasized need for dental surgeon consult. Counseled patient on potential for adverse effects with medications prescribed/recommended today, strict ER and return-to-clinic precautions discussed, patient verbalized understanding.    Christopher Savannah, [place] 03/07/24 1445

## 2024-05-21 ENCOUNTER — Other Ambulatory Visit (HOSPITAL_COMMUNITY): Payer: Self-pay

## 2024-05-21 ENCOUNTER — Encounter: Payer: Self-pay | Admitting: Nurse Practitioner

## 2024-05-21 ENCOUNTER — Ambulatory Visit (INDEPENDENT_AMBULATORY_CARE_PROVIDER_SITE_OTHER): Payer: Self-pay | Admitting: Nurse Practitioner

## 2024-05-21 ENCOUNTER — Other Ambulatory Visit: Payer: Self-pay

## 2024-05-21 VITALS — BP 117/73 | HR 81 | Wt 198.8 lb

## 2024-05-21 DIAGNOSIS — E785 Hyperlipidemia, unspecified: Secondary | ICD-10-CM | POA: Insufficient documentation

## 2024-05-21 DIAGNOSIS — N529 Male erectile dysfunction, unspecified: Secondary | ICD-10-CM | POA: Insufficient documentation

## 2024-05-21 DIAGNOSIS — E1165 Type 2 diabetes mellitus with hyperglycemia: Secondary | ICD-10-CM

## 2024-05-21 DIAGNOSIS — E1140 Type 2 diabetes mellitus with diabetic neuropathy, unspecified: Secondary | ICD-10-CM

## 2024-05-21 DIAGNOSIS — K5900 Constipation, unspecified: Secondary | ICD-10-CM | POA: Insufficient documentation

## 2024-05-21 LAB — POCT GLYCOSYLATED HEMOGLOBIN (HGB A1C): Hemoglobin A1C: 10.6 % — AB (ref 4.0–5.6)

## 2024-05-21 MED ORDER — POLYETHYLENE GLYCOL 3350 17 GM/SCOOP PO POWD
17.0000 g | Freq: Every day | ORAL | 1 refills | Status: AC | PRN
  Filled 2024-05-21: qty 238, 14d supply, fill #0
  Filled 2024-05-21: qty 510, 30d supply, fill #0

## 2024-05-21 MED ORDER — LANCET DEVICE MISC
1.0000 | 0 refills | Status: AC
  Filled 2024-05-21: qty 1, fill #0

## 2024-05-21 MED ORDER — TRUE METRIX METER W/DEVICE KIT
1.0000 | PACK | 0 refills | Status: AC
  Filled 2024-05-21 (×2): qty 1, 30d supply, fill #0

## 2024-05-21 MED ORDER — GABAPENTIN 100 MG PO CAPS
100.0000 mg | ORAL_CAPSULE | Freq: Three times a day (TID) | ORAL | 3 refills | Status: AC
  Filled 2024-05-21 (×2): qty 90, 30d supply, fill #0

## 2024-05-21 MED ORDER — METFORMIN HCL ER 500 MG PO TB24
1000.0000 mg | ORAL_TABLET | Freq: Two times a day (BID) | ORAL | 3 refills | Status: DC
  Filled 2024-05-21 (×2): qty 180, 45d supply, fill #0

## 2024-05-21 MED ORDER — GABAPENTIN 100 MG PO CAPS
300.0000 mg | ORAL_CAPSULE | Freq: Three times a day (TID) | ORAL | 3 refills | Status: DC
  Filled 2024-05-21: qty 90, 10d supply, fill #0

## 2024-05-21 MED ORDER — TRULICITY 0.75 MG/0.5ML ~~LOC~~ SOAJ
0.7500 mg | SUBCUTANEOUS | 0 refills | Status: AC
  Filled 2024-05-21 (×2): qty 2, 28d supply, fill #0

## 2024-05-21 MED ORDER — LANCETS 28G THIN MISC
1.0000 | Freq: Four times a day (QID) | 0 refills | Status: AC
  Filled 2024-05-21 (×2): qty 100, 25d supply, fill #0

## 2024-05-21 MED ORDER — BLOOD GLUCOSE TEST VI STRP
1.0000 | ORAL_STRIP | Freq: Four times a day (QID) | 0 refills | Status: AC
  Filled 2024-05-21 (×2): qty 100, 25d supply, fill #0

## 2024-05-21 MED ORDER — METFORMIN HCL ER 500 MG PO TB24
1000.0000 mg | ORAL_TABLET | Freq: Two times a day (BID) | ORAL | 3 refills | Status: DC
  Filled 2024-05-21: qty 180, 45d supply, fill #0

## 2024-05-21 NOTE — Progress Notes (Signed)
 Established Patient Office Visit  Subjective:  Patient ID: Tyrone Steele, male    DOB: 11/12/1985  Age: 38 y.o. MRN: [id number]  CC:  Chief Complaint  Patient presents with   Diabetes    HPI   Discussed the use of AI scribe software for clinical note transcription with the patient, who gave verbal consent to proceed.  History of Present Illness Tyrone Steele is a 38 year old male with diabetes who presents with tingling in his feet and medication non-compliance.  He experiences tingling sensations in his feet, which have been ongoing for several months. This has been ongoing for several months. He has not been taking his diabetes medications due to a situation where he and his family were displaced and unable to retrieve their belongings, including his medications. He is not currently taking his prescribed metformin  1000 mg twice daily or Trulicity  3 mg, and he has not been monitoring his blood sugar levels as he no longer has the necessary equipment. His last A1c was 10.6, which is an increase from 7.8 ten months ago.  He describes a sensation of constipation with a feeling of pressure in the lower abdomen. Previously, he had daily bowel movements, but now they occur every three days. He has not been using any stool softeners or laxatives.    He also reports erectile dysfunction that started recently he had read about it and he found out that he uncontrolled diabetes can be causing his erectile dysfunction  He reports difficulty with physical activity, stating 'it's hard' and mentions having only temporary jobs. He also notes issues with erectile dysfunction, which has been present since last week.  No excessive urination, thirst, or hunger. He consumes sugary foods and drinks. He does not smoke.     Assessment & Plan .    Past Medical History:  Diagnosis Date   Anemia    Diabetes mellitus without complication (HCC)    GERD without esophagitis 12/02/2020    Hyperlipidemia     Past Surgical History:  Procedure Laterality Date   NO PAST SURGERIES      Family History  Problem Relation Age of Onset   Diabetes Mother    Hypertension Father    Diabetes Father    Stroke Cousin     Social History   Socioeconomic History   Marital status: Single    Spouse name: Not on file   Number of children: 8   Years of education: Not on file   Highest education level: GED or equivalent  Occupational History   Not on file  Tobacco Use   Smoking status: Never   Smokeless tobacco: Never  Vaping Use   Vaping status: Never Used  Substance and Sexual Activity   Alcohol use: No   Drug use: No   Sexual activity: Yes  Other Topics Concern   Not on file  Social History Narrative   Lives with his fiance   Social Drivers of Health   Financial Resource Strain: High Risk (05/20/2024)   Overall Financial Resource Strain (CARDIA)    Difficulty of Paying Living Expenses: Very hard  Food Insecurity: Food Insecurity Present (05/20/2024)   Hunger Vital Sign    Worried About Running Out of Food in the Last Year: Sometimes true    Ran Out of Food in the Last Year: Sometimes true  Transportation Needs: No Transportation Needs (05/20/2024)   PRAPARE - Transportation    Lack of Transportation (Medical): No    Lack of  Transportation (Non-Medical): No  Physical Activity: Inactive (05/20/2024)   Exercise Vital Sign    Days of Exercise per Week: 0 days    Minutes of Exercise per Session: Not on file  Stress: Stress Concern Present (05/20/2024)   [employer] of Occupational Health - Occupational Stress Questionnaire    Feeling of Stress: Very much  Social Connections: Moderately Integrated (05/20/2024)   Social Connection and Isolation Panel    Frequency of Communication with Friends and Family: Three times a week    Frequency of Social Gatherings with Friends and Family: Once a week    Attends Religious Services: 1 to 4 times per year    Active Member of  [employer] or Organizations: No    Attends Engineer, Structural: Not on file    Marital Status: Living with partner  Intimate Partner Violence: Not on file    Outpatient Medications Prior to Visit  Medication Sig Dispense Refill   famotidine  (PEPCID ) 20 MG tablet Take 1 tablet (20 mg total) by mouth at bedtime. 14 tablet 0   naproxen  (NAPROSYN ) 500 MG tablet Take 1 tablet (500 mg total) by mouth 2 (two) times daily with a meal. 30 tablet 0   promethazine -dextromethorphan (PROMETHAZINE -DM) 6.25-15 MG/5ML syrup Take 5 mLs by mouth at bedtime as needed for cough. 25 mL 0   TRUEplus Lancets 28G MISC Use to measure blood sugar twice a day 100 each 1   Blood Glucose Monitoring Suppl (BLOOD GLUCOSE MONITOR SYSTEM) w/Device KIT Test blood sugar in the morning and at bedtime. 1 kit 0   amoxicillin -clavulanate (AUGMENTIN ) 875-125 MG tablet Take 1 tablet by mouth 2 (two) times daily. (Patient not taking: Reported on 05/21/2024) 20 tablet 0   fluticasone  (FLONASE ) 50 MCG/ACT nasal spray Place 1 spray into both nostrils daily. (Patient not taking: Reported on 05/21/2024) 16 g 0   Dulaglutide  (TRULICITY ) 3 MG/0.5ML SOAJ Inject 3 mg as directed once a week. (Patient not taking: Reported on 05/21/2024) 2 mL 1   metFORMIN  (GLUCOPHAGE -XR) 500 MG 24 hr tablet Take 2 tablets (1,000 mg total) by mouth 2 (two) times daily with a meal. (Patient not taking: Reported on 05/21/2024) 180 tablet 3   No facility-administered medications prior to visit.    Allergies  Allergen Reactions   Statins Nausea Only and Other (See Comments)    Severe fatigue    ROS Review of Systems  Constitutional:  Negative for appetite change, chills, fatigue and fever.  HENT:  Negative for congestion, postnasal drip, rhinorrhea and sneezing.   Respiratory:  Negative for cough, shortness of breath and wheezing.   Cardiovascular:  Negative for chest pain, palpitations and leg swelling.  Gastrointestinal:  Positive for constipation.  Negative for abdominal pain, nausea and vomiting.  Endocrine: Negative for polydipsia, polyphagia and polyuria.  Genitourinary:  Negative for difficulty urinating, dysuria, flank pain and frequency.  Musculoskeletal:  Negative for arthralgias, back pain, joint swelling and myalgias.  Skin:  Negative for color change, pallor, rash and wound.  Neurological:  Negative for dizziness, facial asymmetry, weakness and headaches.  Psychiatric/Behavioral:  Negative for behavioral problems, confusion, self-injury and suicidal ideas.       Objective:    Physical Exam Vitals and nursing note reviewed.  Constitutional:      General: He is not in acute distress.    Appearance: Normal appearance. He is not ill-appearing, toxic-appearing or diaphoretic.  Eyes:     General: No scleral icterus.       Right eye: No  discharge.        Left eye: No discharge.     Extraocular Movements: Extraocular movements intact.     Conjunctiva/sclera: Conjunctivae normal.  Cardiovascular:     Rate and Rhythm: Normal rate and regular rhythm.     Pulses: Normal pulses.     Heart sounds: Normal heart sounds. No murmur heard.    No friction rub. No gallop.  Pulmonary:     Effort: Pulmonary effort is normal. No respiratory distress.     Breath sounds: Normal breath sounds. No stridor. No wheezing, rhonchi or rales.  Chest:     Chest wall: No tenderness.  Abdominal:     General: There is no distension.     Palpations: Abdomen is soft.     Tenderness: There is no abdominal tenderness. There is no right CVA tenderness, left CVA tenderness or guarding.  Musculoskeletal:        General: No swelling, tenderness, deformity or signs of injury.     Right lower leg: No edema.     Left lower leg: No edema.  Skin:    General: Skin is warm and dry.     Capillary Refill: Capillary refill takes less than 2 seconds.     Coloration: Skin is not jaundiced or pale.     Findings: No bruising, erythema or lesion.  Neurological:      Mental Status: He is alert and oriented to person, place, and time.     Motor: No weakness.     Coordination: Coordination normal.     Gait: Gait normal.  Psychiatric:        Mood and Affect: Mood normal.        Behavior: Behavior normal.        Thought Content: Thought content normal.        Judgment: Judgment normal.     BP 117/73   Pulse 81   Wt 198 lb 12.8 oz (90.2 kg)   SpO2 100%   BMI 29.36 kg/m  Wt Readings from Last 3 Encounters:  05/21/24 198 lb 12.8 oz (90.2 kg)  02/25/24 210 lb (95.3 kg)  07/26/23 208 lb (94.3 kg)    No results found for: TSH Lab Results  Component Value Date   WBC 4.9 09/01/2021   HGB 13.5 09/01/2021   HCT 40.8 09/01/2021   MCV 83 09/01/2021   PLT 210 09/01/2021   Lab Results  Component Value Date   NA 139 04/21/2023   K 4.2 04/21/2023   CO2 22 04/21/2023   GLUCOSE 245 (H) 04/21/2023   BUN 16 04/21/2023   CREATININE 0.97 04/21/2023   BILITOT 0.4 04/21/2023   ALKPHOS 64 04/21/2023   AST 17 04/21/2023   ALT 27 04/21/2023   PROT 7.4 04/21/2023   ALBUMIN 4.5 04/21/2023   CALCIUM  9.5 04/21/2023   ANIONGAP 12 08/09/2020   EGFR 102 04/21/2023   Lab Results  Component Value Date   CHOL 165 05/26/2023   Lab Results  Component Value Date   HDL 38 (L) 05/26/2023   Lab Results  Component Value Date   LDLCALC 111 (H) 05/26/2023   Lab Results  Component Value Date   TRIG 85 05/26/2023   Lab Results  Component Value Date   CHOLHDL 4.3 05/26/2023   Lab Results  Component Value Date   HGBA1C 10.6 (A) 05/21/2024      Assessment & Plan:   Problem List Items Addressed This Visit       Endocrine  Uncontrolled type 2 diabetes mellitus with hyperglycemia (HCC) - Primary   Lab Results  Component Value Date   HGBA1C 10.6 (A) 05/21/2024   Type 2 diabetes with poor glycemic control, A1c 10.6. Neuropathy with foot tingling. Erectile dysfunction likely due to uncontrolled diabetes. Non-adherence due to financial constraints.  Discussed complications of uncontrolled diabetes. - Reordered metformin  1000 mg BID. - Reordered dulaglutide  0.75 mg, titrate as needed. - Referred to pharmacist for patient assistance program. - Ordered blood glucose monitoring supplies.  CBG goals discussed Patient counseled on low-carb diet        Relevant Medications   Dulaglutide  (TRULICITY ) 0.75 MG/0.5ML SOAJ   metFORMIN  (GLUCOPHAGE -XR) 500 MG 24 hr tablet   Blood Glucose Monitoring Suppl (ACCU-CHEK GUIDE) w/Device KIT   Glucose Blood (BLOOD GLUCOSE TEST STRIPS) STRP   Lancet Device MISC   Lancets MISC   Other Relevant Orders   HgB A1c (Completed)   CBC   CMP14+EGFR   Microalbumin / creatinine urine ratio   AMB Referral [facility] Care Management   Ambulatory referral to Ophthalmology   Neuropathy due to type 2 diabetes mellitus (HCC)   Gabapentin  100 mg 3 times daily refilled      Relevant Medications   Dulaglutide  (TRULICITY ) 0.75 MG/0.5ML SOAJ   metFORMIN  (GLUCOPHAGE -XR) 500 MG 24 hr tablet   gabapentin  (NEURONTIN ) 100 MG capsule     Other   Dyslipidemia, goal LDL below 100   Lab Results  Component Value Date   CHOL 165 05/26/2023   HDL 38 (L) 05/26/2023   LDLCALC 111 (H) 05/26/2023   TRIG 85 05/26/2023   CHOLHDL 4.3 05/26/2023   Non-adherence to cholesterol medication due to financial constraints. - Currently not on medication Checking direct LDL       Relevant Orders   Direct LDL   Erectile dysfunction   No interested in starting medication checking testosterone level Need to get diabetes under control discussed      Relevant Orders   Testosterone   Constipation    Constipation with bowel movements every three days, pressure and difficulty noted. - Recommended Miralax 17 grams daily PRN. - Advised increasing water intake to 64 ounces daily. - Encouraged consumption of vegetables.       Relevant Medications   polyethylene glycol powder (GLYCOLAX/MIRALAX) 17 GM/SCOOP powder    Meds ordered  this encounter  Medications   DISCONTD: metFORMIN  (GLUCOPHAGE -XR) 500 MG 24 hr tablet    Sig: Take 2 tablets (1,000 mg total) by mouth 2 (two) times daily with a meal.    Dispense:  180 tablet    Refill:  3   Dulaglutide  (TRULICITY ) 0.75 MG/0.5ML SOAJ    Sig: Inject 0.75 mg into the skin once a week.    Dispense:  2 mL    Refill:  0   metFORMIN  (GLUCOPHAGE -XR) 500 MG 24 hr tablet    Sig: Take 2 tablets (1,000 mg total) by mouth 2 (two) times daily with a meal.    Dispense:  180 tablet    Refill:  3   Blood Glucose Monitoring Suppl (ACCU-CHEK GUIDE) w/Device KIT    Sig: use as directed    Dispense:  1 kit    Refill:  0   Glucose Blood (BLOOD GLUCOSE TEST STRIPS) STRP    Sig: Use up to four times daily as directed.    Dispense:  100 strip    Refill:  0   Lancet Device MISC    Sig: 1 each by Does not apply  route as directed. Dispense based on patient and insurance preference. Use up to four times daily as directed. (FOR ICD-10 E10.9, E11.9).    Dispense:  1 each    Refill:  0   Lancets MISC    Sig: Use up to four times daily as directed.    Dispense:  100 each    Refill:  0   polyethylene glycol powder (GLYCOLAX/MIRALAX) 17 GM/SCOOP powder    Sig: Take 17 g by mouth daily as needed. Dissolve 1 capful (17g) in 4-8 ounces of liquid and take by mouth daily.    Dispense:  714 g    Refill:  1   DISCONTD: gabapentin  (NEURONTIN ) 100 MG capsule    Sig: Take 3 capsules (300 mg total) by mouth 3 (three) times daily.    Dispense:  90 capsule    Refill:  3   gabapentin  (NEURONTIN ) 100 MG capsule    Sig: Take 1 capsule (100 mg total) by mouth 3 (three) times daily.    Dispense:  90 capsule    Refill:  3    Follow-up: Return in about 5 weeks (around 06/25/2024) for DM.    Ashten Prats R Sahira Cataldi, FNP

## 2024-05-21 NOTE — Assessment & Plan Note (Signed)
 No interested in starting medication checking testosterone level Need to get diabetes under control discussed

## 2024-05-21 NOTE — Assessment & Plan Note (Signed)
 Gabapentin  100 mg 3 times daily refilled

## 2024-05-21 NOTE — Assessment & Plan Note (Signed)
 Lab Results  Component Value Date   HGBA1C 10.6 (A) 05/21/2024   Type 2 diabetes with poor glycemic control, A1c 10.6. Neuropathy with foot tingling. Erectile dysfunction likely due to uncontrolled diabetes. Non-adherence due to financial constraints. Discussed complications of uncontrolled diabetes. - Reordered metformin  1000 mg BID. - Reordered dulaglutide  0.75 mg, titrate as needed. - Referred to pharmacist for patient assistance program. - Ordered blood glucose monitoring supplies.  CBG goals discussed Patient counseled on low-carb diet

## 2024-05-21 NOTE — Assessment & Plan Note (Signed)
  Constipation with bowel movements every three days, pressure and difficulty noted. - Recommended Miralax 17 grams daily PRN. - Advised increasing water intake to 64 ounces daily. - Encouraged consumption of vegetables.

## 2024-05-21 NOTE — Patient Instructions (Addendum)
   Uncontrolled type 2 diabetes mellitus with hyperglycemia (HCC) (Primary)  - HgB A1c - CBC - CMP14+EGFR - Microalbumin / creatinine urine ratio - Dulaglutide  (TRULICITY ) 0.75 MG/0.5ML SOAJ; Inject 0.75 mg into the skin once a week.  Dispense: 2 mL; Refill: 0 - metFORMIN  (GLUCOPHAGE -XR) 500 MG 24 hr tablet; Take 2 tablets (1,000 mg total) by mouth 2 (two) times daily with a meal.  Dispense: 180 tablet; Refill: 3  - AMB Referral [facility] Care Management    Constipation, unspecified constipation type  - polyethylene glycol powder (GLYCOLAX/MIRALAX) 17 GM/SCOOP powder; Take 17 g by mouth daily as needed. Dissolve 1 capful (17g) in 4-8 ounces of liquid and take by mouth daily.  Dispense: 578 g; Refill: 1   Neuropathy due to type 2 diabetes mellitus (HCC)  - gabapentin  (NEURONTIN ) 100 MG capsule; Take 3 capsules (300 mg total) by mouth 3 (three) times daily.  Dispense: 90 capsule; Refill: 3       It is important that you exercise regularly at least 30 minutes 5 times a week as tolerated  Think about what you will eat, plan ahead. Choose  clean, green, fresh or frozen over canned, processed or packaged foods which are more sugary, salty and fatty. 70 to 75% of food eaten should be vegetables and fruit. Three meals at set times with snacks allowed between meals, but they must be fruit or vegetables. Aim to eat over a 12 hour period , example 7 am to 7 pm, and STOP after  your last meal of the day. Drink water,generally about 64 ounces per day, no other drink is as healthy. Fruit juice is best enjoyed in a healthy way, by EATING the fruit.  Thanks for choosing [facility] we consider it a privelige to serve you.

## 2024-05-21 NOTE — Assessment & Plan Note (Signed)
 Lab Results  Component Value Date   CHOL 165 05/26/2023   HDL 38 (L) 05/26/2023   LDLCALC 111 (H) 05/26/2023   TRIG 85 05/26/2023   CHOLHDL 4.3 05/26/2023   Non-adherence to cholesterol medication due to financial constraints. - Currently not on medication Checking direct LDL

## 2024-05-22 ENCOUNTER — Other Ambulatory Visit: Payer: Self-pay

## 2024-05-22 ENCOUNTER — Other Ambulatory Visit: Payer: Self-pay | Admitting: Nurse Practitioner

## 2024-05-22 ENCOUNTER — Ambulatory Visit: Payer: Self-pay | Admitting: Nurse Practitioner

## 2024-05-22 ENCOUNTER — Other Ambulatory Visit (HOSPITAL_COMMUNITY): Payer: Self-pay

## 2024-05-22 ENCOUNTER — Ambulatory Visit
Admission: EM | Admit: 2024-05-22 | Discharge: 2024-05-22 | Disposition: A | Discharge status: Home / Self Care | Payer: Self-pay | Attending: Family Medicine | Admitting: Family Medicine

## 2024-05-22 DIAGNOSIS — S39012A Strain of muscle, fascia and tendon of lower back, initial encounter: Secondary | ICD-10-CM

## 2024-05-22 DIAGNOSIS — S161XXA Strain of muscle, fascia and tendon at neck level, initial encounter: Secondary | ICD-10-CM

## 2024-05-22 DIAGNOSIS — E785 Hyperlipidemia, unspecified: Secondary | ICD-10-CM

## 2024-05-22 LAB — CMP14+EGFR
ALT: 21 IU/L (ref 0–44)
AST: 16 IU/L (ref 0–40)
Albumin: 4.4 g/dL (ref 4.1–5.1)
Alkaline Phosphatase: 57 IU/L (ref 47–123)
BUN/Creatinine Ratio: 11 (ref 9–20)
BUN: 12 mg/dL (ref 6–20)
Bilirubin Total: 0.4 mg/dL (ref 0.0–1.2)
CO2: 22 mmol/L (ref 20–29)
Calcium: 9.5 mg/dL (ref 8.7–10.2)
Chloride: 106 mmol/L (ref 96–106)
Creatinine, Ser: 1.13 mg/dL (ref 0.76–1.27)
Globulin, Total: 2.8 g/dL (ref 1.5–4.5)
Glucose: 169 mg/dL — ABNORMAL HIGH (ref 70–99)
Potassium: 3.8 mmol/L (ref 3.5–5.2)
Sodium: 140 mmol/L (ref 134–144)
Total Protein: 7.2 g/dL (ref 6.0–8.5)
eGFR: 85 mL/min/1.73 (ref >59)

## 2024-05-22 LAB — CBC
Hematocrit: 43.5 % (ref 37.5–51.0)
Hemoglobin: 14 g/dL (ref 13.0–17.7)
MCH: 26.9 pg (ref 26.6–33.0)
MCHC: 32.2 g/dL (ref 31.5–35.7)
MCV: 84 fL (ref 79–97)
Platelets: 192 x10E3/uL (ref 150–450)
RBC: 5.21 x10E6/uL (ref 4.14–5.80)
RDW: 13.4 % (ref 11.6–15.4)
WBC: 4.9 x10E3/uL (ref 3.4–10.8)

## 2024-05-22 LAB — MICROALBUMIN / CREATININE URINE RATIO
Creatinine, Urine: 198.5 mg/dL
Microalb/Creat Ratio: 4 mg/g{creat} (ref 0–29)
Microalbumin, Urine: 8.4 ug/mL

## 2024-05-22 LAB — TESTOSTERONE: Testosterone: 211 ng/dL — ABNORMAL LOW (ref 264–916)

## 2024-05-22 LAB — LDL CHOLESTEROL, DIRECT: LDL Direct: 106 mg/dL — ABNORMAL HIGH (ref 0–99)

## 2024-05-22 MED ORDER — METHOCARBAMOL 500 MG PO TABS
500.0000 mg | ORAL_TABLET | Freq: Two times a day (BID) | ORAL | 0 refills | Status: AC | PRN
  Filled 2024-05-22 (×2): qty 10, 5d supply, fill #0

## 2024-05-22 MED ORDER — NAPROXEN 500 MG PO TABS
500.0000 mg | ORAL_TABLET | Freq: Two times a day (BID) | ORAL | 0 refills | Status: AC | PRN
  Filled 2024-05-22 (×2): qty 14, 7d supply, fill #0

## 2024-05-22 MED ORDER — ROSUVASTATIN CALCIUM 5 MG PO TABS
5.0000 mg | ORAL_TABLET | Freq: Every day | ORAL | 1 refills | Status: DC
  Filled 2024-05-22 (×2): qty 60, 60d supply, fill #0
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
## 2024-05-22 NOTE — Discharge Instructions (Signed)
 You may start Robaxin  twice daily as needed.  Please note this is a muscle relaxer and will make you drowsy.  Do not drink alcohol or drive while on this medication.  You may take naproxen  twice daily as needed, start tonight as she took ibuprofen  this morning.  Heat to the affected areas and lots of rest.  Please follow-up with your PCP if your symptoms do not improve.  Please go to the ER for any worsening symptoms.  I hope you feel better soon!
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
## 2024-05-23 ENCOUNTER — Other Ambulatory Visit: Payer: Self-pay

## 2024-05-28 ENCOUNTER — Ambulatory Visit
Admission: EM | Admit: 2024-05-28 | Discharge: 2024-05-28 | Disposition: A | Discharge status: Home / Self Care | Payer: Self-pay | Attending: Family Medicine | Admitting: Family Medicine

## 2024-05-28 ENCOUNTER — Other Ambulatory Visit: Payer: Self-pay

## 2024-05-28 ENCOUNTER — Other Ambulatory Visit (HOSPITAL_COMMUNITY): Payer: Self-pay

## 2024-05-28 DIAGNOSIS — S39012A Strain of muscle, fascia and tendon of lower back, initial encounter: Secondary | ICD-10-CM

## 2024-05-28 DIAGNOSIS — S161XXD Strain of muscle, fascia and tendon at neck level, subsequent encounter: Secondary | ICD-10-CM

## 2024-05-28 MED ORDER — METHYLPREDNISOLONE 4 MG PO TBPK
ORAL_TABLET | ORAL | 0 refills | Status: DC
  Filled 2024-05-28: qty 21, 6d supply, fill #0

## 2024-05-28 MED ORDER — METHYLPREDNISOLONE SODIUM SUCC 125 MG IJ SOLR
60.0000 mg | Freq: Once | INTRAMUSCULAR | Status: AC
  Administered 2024-05-28: 60 mg via INTRAMUSCULAR

## 2024-05-28 NOTE — Discharge Instructions (Addendum)
 You were given a steroid shot while in the clinic.  You may continue the muscle relaxer or naproxen  as previously prescribed.  Start the Medrol  Dosepak tomorrow, 11/12.  Heat to the affected areas and rest and please follow-up with your PCP if your symptoms are not improving.  Please go to the emergency room if you develop any worsening symptoms.  Hope you feel better soon!
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
## 2024-05-28 NOTE — ED Triage Notes (Signed)
 Pt states still having pain in neck and back and stating he has int HA.

## 2024-05-30 ENCOUNTER — Telehealth: Payer: Self-pay

## 2024-05-30 NOTE — Progress Notes (Unsigned)
 Care Guide Pharmacy Note  05/30/2024 Name: Tyrone Steele MRN: [id number] DOB: 13-Jan-1986  Referred By: Paseda, Folashade R, FNP Reason for referral: Complex Care Management and Call Attempt #1 (Successful initial outreach to schedule with PHARM D-[place])   Tyrone Steele is a 38 y.o. year old male who is a primary care patient of Paseda, Folashade R, FNP.  Tyrone Steele was referred to the pharmacist for assistance related to: DMII  An unsuccessful telephone outreach was attempted today to contact the patient who was referred to the pharmacy team for assistance with medication assistance. Additional attempts will be made to contact the patient.  Leotis Rase [facility], [facility] Guide  Direct Dial: [phone]  Fax [phone]

## 2024-05-31 ENCOUNTER — Other Ambulatory Visit: Payer: Self-pay | Admitting: Nurse Practitioner

## 2024-05-31 DIAGNOSIS — R7989 Other specified abnormal findings of blood chemistry: Secondary | ICD-10-CM

## 2024-05-31 DIAGNOSIS — E1140 Type 2 diabetes mellitus with diabetic neuropathy, unspecified: Secondary | ICD-10-CM

## 2024-05-31 DIAGNOSIS — N529 Male erectile dysfunction, unspecified: Secondary | ICD-10-CM

## 2024-05-31 NOTE — Progress Notes (Signed)
 Care Guide Pharmacy Note  05/31/2024 Name: Tyrone Steele MRN: [id number] DOB: 1985-07-30  Referred By: Paseda, Folashade R, FNP Reason for referral: Complex Care Management, Call Attempt #1 (Unsuccessful initial outreach to schedule with PHARM D-[place]), and Call Attempt #2 (Successful initial outreach scheduled with PHARM D- [place])   Tyrone Steele is a 38 y.o. year old male who is a primary care patient of Paseda, Folashade R, FNP.  Tyrone Steele was referred to the pharmacist for assistance related to: DMII  Successful contact was made with the patient to discuss pharmacy services including being ready for the pharmacist to call at least 5 minutes before the scheduled appointment time and to have medication bottles and any blood pressure readings ready for review. The patient agreed to meet with the pharmacist via telephone visit on (date/time). 06/20/24 @ 2 PM  Tyrone Steele Cloria Davene Salome Romell Ralph [place], [facility] Guide  Direct Dial: [phone]  Fax [phone]

## 2024-06-07 ENCOUNTER — Other Ambulatory Visit (HOSPITAL_COMMUNITY): Payer: Self-pay

## 2024-06-20 ENCOUNTER — Telehealth: Payer: Self-pay

## 2024-06-20 ENCOUNTER — Ambulatory Visit: Payer: Self-pay

## 2024-06-20 NOTE — Telephone Encounter (Signed)
 Attempted to contact patient for scheduled appointment for medication management. Left HIPAA compliant message for patient to return my call at their convenience.   Tyrone Steele, PharmD [facility] [phone]

## 2024-06-20 NOTE — Progress Notes (Deleted)
   06/20/2024 Name: Tyrone Steele MRN: [id number] DOB: [birth year]  No chief complaint on file.   Tyrone Steele is a 38 y.o. year old male who presented for a telephone visit.   They were referred to the pharmacist by their PCP for assistance in managing diabetes. SABRA PMH includes ***   Subjective: Patient was last seen by PCP, ***, on ***. At last visit, ***  Today, patient presents in *** good spirits and presents without *** any assistance. ***Patient is accompanied by ***.    Care Team: Primary Care Provider: Paseda, Folashade R, FNP ; Next Scheduled Visit: *** {careteamprovider:27366}  Medication Access/Adherence  Current Pharmacy:  [place] [facility] [address] [place] [address] Phone: [phone] Fax: [phone]  [facility] [address] [place] [address] Phone: [phone] Fax: [phone]   Patient reports affordability concerns with their medications: {YES/NO:21197} Patient reports access/transportation concerns to their pharmacy: {YES/NO:21197} Patient reports adherence concerns with their medications:  {YES/NO:21197} ***  *** Patient denies adherence with medications, reports missing *** medications *** times per week, on average.   {Pharmacy S/O Choices:26420}   Objective:  BP Readings from Last 3 Encounters:  05/28/24 120/79  05/22/24 118/75  05/21/24 117/73    Lab Results  Component Value Date   HGBA1C 10.6 (A) 05/21/2024   HGBA1C 7.8 (A) 07/26/2023   HGBA1C 11.5 (A) 04/21/2023       Latest Ref Rng & Units 05/21/2024    3:20 PM 04/21/2023   10:36 AM 09/01/2021    4:03 PM  BMP  Glucose 70 - 99 mg/dL 830  754  879   BUN 6 - 20 mg/dL 12  16  14    Creatinine 0.76 - 1.27 mg/dL 8.86  9.02  8.99   BUN/Creat Ratio 9 - 20 11  16  14    Sodium 134 - 144 mmol/L 140  139  141   Potassium 3.5 - 5.2 mmol/L 3.8  4.2  4.1   Chloride 96 - 106 mmol/L 106  102   104   CO2 20 - 29 mmol/L 22  22  24    Calcium  8.7 - 10.2 mg/dL 9.5  9.5  89.8     Lab Results  Component Value Date   CHOL 165 05/26/2023   HDL 38 (L) 05/26/2023   LDLCALC 111 (H) 05/26/2023   LDLDIRECT 106 (H) 05/21/2024   TRIG 85 05/26/2023   CHOLHDL 4.3 05/26/2023    Medications Reviewed Today   Medications were not reviewed in this encounter       Assessment/Plan:   {Pharmacy A/P Choices:26421}  Written patient instructions provided. Patient verbalized understanding of treatment plan.   Follow Up Plan: *** Pharmacist *** PCP clinic visit in *** Patient seen with ***  ***

## 2024-06-25 ENCOUNTER — Ambulatory Visit: Payer: Self-pay | Admitting: Nurse Practitioner

## 2024-07-24 ENCOUNTER — Other Ambulatory Visit (HOSPITAL_COMMUNITY): Payer: Self-pay

## 2024-07-24 ENCOUNTER — Encounter: Payer: Self-pay | Admitting: Nurse Practitioner

## 2024-07-24 ENCOUNTER — Ambulatory Visit: Payer: Self-pay | Admitting: Nurse Practitioner

## 2024-07-24 ENCOUNTER — Other Ambulatory Visit: Payer: Self-pay

## 2024-07-24 VITALS — BP 126/78 | HR 72 | Wt 193.0 lb

## 2024-07-24 DIAGNOSIS — E785 Hyperlipidemia, unspecified: Secondary | ICD-10-CM

## 2024-07-24 DIAGNOSIS — E1140 Type 2 diabetes mellitus with diabetic neuropathy, unspecified: Secondary | ICD-10-CM

## 2024-07-24 DIAGNOSIS — E1165 Type 2 diabetes mellitus with hyperglycemia: Secondary | ICD-10-CM

## 2024-07-24 MED ORDER — METFORMIN HCL ER 500 MG PO TB24
1000.0000 mg | ORAL_TABLET | Freq: Two times a day (BID) | ORAL | 3 refills | Status: AC
  Filled 2024-07-24 (×2): qty 180, 45d supply, fill #0

## 2024-07-24 MED ORDER — ROSUVASTATIN CALCIUM 5 MG PO TABS
5.0000 mg | ORAL_TABLET | Freq: Every day | ORAL | 1 refills | Status: AC
  Filled 2024-07-24 (×2): qty 60, 60d supply, fill #0

## 2024-07-24 NOTE — Assessment & Plan Note (Signed)
 Lab Results  Component Value Date   CHOL 165 05/26/2023   HDL 38 (L) 05/26/2023   LDLCALC 111 (H) 05/26/2023   LDLDIRECT 106 (H) 05/21/2024   TRIG 85 05/26/2023   CHOLHDL 4.3 05/26/2023   Management is inconsistent due to non-adherence to medication regimen. - Advised consistent daily intake of crestor  5 mg daily - Will recheck cholesterol levels at next visit

## 2024-07-24 NOTE — Assessment & Plan Note (Signed)
 Prescribed gabapentin  but notes taking medication

## 2024-07-24 NOTE — Assessment & Plan Note (Signed)
 Lab Results  Component Value Date   HGBA1C 10.6 (A) 05/21/2024   Uncontrolled type 2 diabetes mellitus with hyperglycemia Diabetes remains uncontrolled due to inconsistent medication adherence and financial constraints. Risks include kidney disease, stroke, and heart disease. - Increased metformin  to 1000 mg twice daily. - Referred to clinical pharmacist for medication assistance for trulicity  and metformin  - Advised on dietary modifications to reduce carbohydrate intake. - Recommended moderate to vigorous exercise for 30 minutes, five days a week. - Will recheck A1c at next visit.

## 2024-07-24 NOTE — Progress Notes (Signed)
 "  Established Patient Office Visit  Subjective:  Patient ID: Tyrone Steele, male    DOB: February 04, 1986  Age: 39 y.o. MRN: [id number]  CC:  Chief Complaint  Patient presents with   Diabetes    HPI    Discussed the use of AI scribe software for clinical note transcription with the patient, who gave verbal consent to proceed.  History of Present Illness Tyrone Steele is a 39 year old male  has a past medical history of Anemia, Diabetes mellitus without complication (HCC), GERD without esophagitis (12/02/2020), and Hyperlipidemia.  who presents for follow-up on his diabetes management.  He is currently taking metformin  at a dose of 500 mg twice a day, although the prescribed dose is 1000 mg twice a day. He has not been taking Trulicity  due to financial constraints and lack of health insurance. A billing issue has prevented him from obtaining his medications consistently.  He has been Crestor  5mg  daily  which he takes once a day, but not consistently.  In terms of lifestyle modifications, he is attempting to eat healthily by avoiding greasy foods and consuming wheat bread and sugar-free products. He occasionally drinks juice but tries to follow it with water. He is not currently working full-time and is employed through a [employer], which affects his access to [employer]  For physical activity, his work involves constant walking, lifting, and pulling, which keeps him active.    Assessment & Plan .   Past Medical History:  Diagnosis Date   Anemia    Diabetes mellitus without complication (HCC)    GERD without esophagitis 12/02/2020   Hyperlipidemia     Past Surgical History:  Procedure Laterality Date   NO PAST SURGERIES      Family History  Problem Relation Age of Onset   Diabetes Mother    Hypertension Father    Diabetes Father    Stroke Cousin     Social History   Socioeconomic History   Marital status: Single    Spouse name: Not on file   Number of  children: 8   Years of education: Not on file   Highest education level: GED or equivalent  Occupational History   Not on file  Tobacco Use   Smoking status: Never   Smokeless tobacco: Never  Vaping Use   Vaping status: Never Used  Substance and Sexual Activity   Alcohol use: No   Drug use: No   Sexual activity: Yes  Other Topics Concern   Not on file  Social History Narrative   Lives with his fiance   Social Drivers of Health   Tobacco Use: Low Risk (07/24/2024)   Patient History    Smoking Tobacco Use: Never    Smokeless Tobacco Use: Never    Passive Exposure: Not on file  Financial Resource Strain: High Risk (05/20/2024)   Overall Financial Resource Strain (CARDIA)    Difficulty of Paying Living Expenses: Very hard  Food Insecurity: Food Insecurity Present (05/20/2024)   Epic    Worried About Programme Researcher, Broadcasting/film/video in the Last Year: Sometimes true    Ran Out of Food in the Last Year: Sometimes true  Transportation Needs: No Transportation Needs (07/24/2024)   Epic    Lack of Transportation (Medical): No    Lack of Transportation (Non-Medical): No  Physical Activity: Inactive (05/20/2024)   Exercise Vital Sign    Days of Exercise per Week: 0 days    Minutes of Exercise per Session: Not  on file  Stress: Stress Concern Present (05/20/2024)   [employer] of Occupational Health - Occupational Stress Questionnaire    Feeling of Stress: Very much  Social Connections: Moderately Integrated (05/20/2024)   Social Connection and Isolation Panel    Frequency of Communication with Friends and Family: Three times a week    Frequency of Social Gatherings with Friends and Family: Once a week    Attends Religious Services: 1 to 4 times per year    Active Member of [employer] or Organizations: No    Attends Engineer, Structural: Not on file    Marital Status: Living with partner  Intimate Partner Violence: Not At Risk (07/24/2024)   Epic    Fear of Current or Ex-Partner: No     Emotionally Abused: No    Physically Abused: No    Sexually Abused: No  Depression (PHQ2-9): Low Risk (07/24/2024)   Depression (PHQ2-9)    PHQ-2 Score: 0  Recent Concern: Depression (PHQ2-9) - Medium Risk (05/21/2024)   Depression (PHQ2-9)    PHQ-2 Score: 6  Alcohol Screen: Low Risk (05/20/2024)   Alcohol Screen    Last Alcohol Screening Score (AUDIT): 0  Housing: Low Risk (07/24/2024)   Epic    Unable to Pay for Housing in the Last Year: No    Number of Times Moved in the Last Year: 0    Homeless in the Last Year: No  Utilities: Not At Risk (07/24/2024)   Epic    Threatened with loss of utilities: No  Health Literacy: Not on file    Outpatient Medications Prior to Visit  Medication Sig Dispense Refill   famotidine  (PEPCID ) 20 MG tablet Take 1 tablet (20 mg total) by mouth at bedtime. 14 tablet 0   gabapentin  (NEURONTIN ) 100 MG capsule Take 1 capsule (100 mg total) by mouth 3 (three) times daily. 90 capsule 3   methocarbamol  (ROBAXIN ) 500 MG tablet Take 1 tablet (500 mg total) by mouth 2 (two) times daily as needed for muscle spasms. 10 tablet 0   naproxen  (NAPROSYN ) 500 MG tablet Take 1 tablet (500 mg total) by mouth 2 (two) times daily as needed. 14 tablet 0   polyethylene glycol powder (GLYCOLAX /MIRALAX ) 17 GM/SCOOP powder Take 17 g by mouth daily as needed. Dissolve 1 capful (17g) in 4-8 ounces of liquid and take by mouth daily. 714 g 1   TRUEplus Lancets 28G MISC Use to measure blood sugar twice a day 100 each 1   metFORMIN  (GLUCOPHAGE -XR) 500 MG 24 hr tablet Take 2 tablets (1,000 mg total) by mouth 2 (two) times daily with a meal. 180 tablet 3   rosuvastatin  (CRESTOR ) 5 MG tablet Take 1 tablet (5 mg total) by mouth daily. 60 tablet 1   Blood Glucose Monitoring Suppl (TRUE METRIX METER) w/Device KIT Use as directed. (Patient not taking: Reported on 07/24/2024) 1 kit 0   Dulaglutide  (TRULICITY ) 0.75 MG/0.5ML SOAJ Inject 0.75 mg into the skin once a week. (Patient not taking: Reported on  07/24/2024) 2 mL 0   fluticasone  (FLONASE ) 50 MCG/ACT nasal spray Place 1 spray into both nostrils daily. (Patient not taking: Reported on 07/24/2024) 16 g 0   Glucose Blood (BLOOD GLUCOSE TEST STRIPS) STRP Use up to four times daily as directed. (Patient not taking: Reported on 07/24/2024) 100 strip 0   Lancet Device MISC 1 each by Does not apply route as directed. Dispense based on patient and insurance preference. Use up to four times daily as directed. (FOR  ICD-10 E10.9, E11.9). (Patient not taking: Reported on 07/24/2024) 1 each 0   Lancets 28G Thin MISC Check sugar 4 (four) times daily. (Patient not taking: Reported on 07/24/2024) 100 each 0   methylPREDNISolone  (MEDROL  DOSEPAK) 4 MG TBPK tablet Take as prescribed on package (Patient not taking: Reported on 07/24/2024) 21 tablet 0   No facility-administered medications prior to visit.    Allergies[1]  ROS Review of Systems  Constitutional:  Negative for appetite change, chills, fatigue and fever.  HENT:  Negative for congestion, postnasal drip, rhinorrhea and sneezing.   Respiratory:  Negative for cough, shortness of breath and wheezing.   Cardiovascular:  Negative for chest pain, palpitations and leg swelling.  Gastrointestinal:  Negative for abdominal pain, constipation, nausea and vomiting.  Genitourinary:  Negative for difficulty urinating, dysuria, flank pain and frequency.  Musculoskeletal:  Negative for arthralgias, back pain, joint swelling and myalgias.  Skin:  Negative for color change, pallor, rash and wound.  Neurological:  Negative for dizziness, facial asymmetry, weakness, numbness and headaches.  Psychiatric/Behavioral:  Negative for behavioral problems, confusion, self-injury and suicidal ideas.       Objective:    Physical Exam Vitals and nursing note reviewed.  Constitutional:      General: He is not in acute distress.    Appearance: Normal appearance. He is not ill-appearing, toxic-appearing or diaphoretic.  Eyes:      General: No scleral icterus.       Right eye: No discharge.        Left eye: No discharge.     Extraocular Movements: Extraocular movements intact.     Conjunctiva/sclera: Conjunctivae normal.  Cardiovascular:     Rate and Rhythm: Normal rate and regular rhythm.     Pulses: Normal pulses.     Heart sounds: Normal heart sounds. No murmur heard.    No friction rub. No gallop.  Pulmonary:     Effort: Pulmonary effort is normal. No respiratory distress.     Breath sounds: Normal breath sounds. No stridor. No wheezing, rhonchi or rales.  Chest:     Chest wall: No tenderness.  Abdominal:     General: There is no distension.     Palpations: Abdomen is soft.     Tenderness: There is no abdominal tenderness. There is no right CVA tenderness, left CVA tenderness or guarding.  Musculoskeletal:        General: No swelling, tenderness, deformity or signs of injury.     Right lower leg: No edema.     Left lower leg: No edema.  Skin:    General: Skin is warm and dry.     Capillary Refill: Capillary refill takes less than 2 seconds.     Coloration: Skin is not jaundiced or pale.     Findings: No bruising, erythema or lesion.  Neurological:     Mental Status: He is alert and oriented to person, place, and time.     Motor: No weakness.     Gait: Gait normal.  Psychiatric:        Mood and Affect: Mood normal.        Behavior: Behavior normal.        Thought Content: Thought content normal.        Judgment: Judgment normal.     BP 126/78   Pulse 72   Wt 193 lb (87.5 kg)   SpO2 100%   BMI 28.50 kg/m  Wt Readings from Last 3 Encounters:  07/24/24 193 lb (87.5 kg)  05/21/24 198 lb 12.8 oz (90.2 kg)  02/25/24 210 lb (95.3 kg)    No results found for: TSH Lab Results  Component Value Date   WBC 4.9 05/21/2024   HGB 14.0 05/21/2024   HCT 43.5 05/21/2024   MCV 84 05/21/2024   PLT 192 05/21/2024   Lab Results  Component Value Date   NA 140 05/21/2024   K 3.8 05/21/2024   CO2 22  05/21/2024   GLUCOSE 169 (H) 05/21/2024   BUN 12 05/21/2024   CREATININE 1.13 05/21/2024   BILITOT 0.4 05/21/2024   ALKPHOS 57 05/21/2024   AST 16 05/21/2024   ALT 21 05/21/2024   PROT 7.2 05/21/2024   ALBUMIN 4.4 05/21/2024   CALCIUM  9.5 05/21/2024   ANIONGAP 12 08/09/2020   EGFR 85 05/21/2024   Lab Results  Component Value Date   CHOL 165 05/26/2023   Lab Results  Component Value Date   HDL 38 (L) 05/26/2023   Lab Results  Component Value Date   LDLCALC 111 (H) 05/26/2023   Lab Results  Component Value Date   TRIG 85 05/26/2023   Lab Results  Component Value Date   CHOLHDL 4.3 05/26/2023   Lab Results  Component Value Date   HGBA1C 10.6 (A) 05/21/2024      Assessment & Plan:   Problem List Items Addressed This Visit       Endocrine   Uncontrolled type 2 diabetes mellitus with hyperglycemia (HCC)   Lab Results  Component Value Date   HGBA1C 10.6 (A) 05/21/2024   Uncontrolled type 2 diabetes mellitus with hyperglycemia Diabetes remains uncontrolled due to inconsistent medication adherence and financial constraints. Risks include kidney disease, stroke, and heart disease. - Increased metformin  to 1000 mg twice daily. - Referred to clinical pharmacist for medication assistance for trulicity  and metformin  - Advised on dietary modifications to reduce carbohydrate intake. - Recommended moderate to vigorous exercise for 30 minutes, five days a week. - Will recheck A1c at next visit.        Relevant Medications   metFORMIN  (GLUCOPHAGE -XR) 500 MG 24 hr tablet   rosuvastatin  (CRESTOR ) 5 MG tablet   Other Relevant Orders   AMB Referral [facility]BCI Care Management   Neuropathy due to type 2 diabetes mellitus (HCC) - Primary   Prescribed gabapentin  but notes taking medication      Relevant Medications   metFORMIN  (GLUCOPHAGE -XR) 500 MG 24 hr tablet   rosuvastatin  (CRESTOR ) 5 MG tablet     Other   Dyslipidemia, goal LDL below 100   Lab Results  Component Value  Date   CHOL 165 05/26/2023   HDL 38 (L) 05/26/2023   LDLCALC 111 (H) 05/26/2023   LDLDIRECT 106 (H) 05/21/2024   TRIG 85 05/26/2023   CHOLHDL 4.3 05/26/2023   Management is inconsistent due to non-adherence to medication regimen. - Advised consistent daily intake of crestor  5 mg daily - Will recheck cholesterol levels at next visit      Relevant Medications   rosuvastatin  (CRESTOR ) 5 MG tablet   Other Relevant Orders   AMB Referral VBCI Care Management    Meds ordered this encounter  Medications   metFORMIN  (GLUCOPHAGE -XR) 500 MG 24 hr tablet    Sig: Take 2 tablets (1,000 mg total) by mouth 2 (two) times daily with a meal.    Dispense:  180 tablet    Refill:  3   rosuvastatin  (CRESTOR ) 5 MG tablet    Sig: Take 1 tablet (5 mg total) by mouth  daily.    Dispense:  60 tablet    Refill:  1    Follow-up: Return in about 6 weeks (around 09/04/2024).    Hunt Zajicek R Clifford Coudriet, FNP     [1]  Allergies Allergen Reactions   Statins Nausea Only and Other (See Comments)    Severe fatigue   "

## 2024-07-24 NOTE — Patient Instructions (Signed)
 1. Uncontrolled type 2 diabetes mellitus with hyperglycemia (HCC) - metFORMIN  (GLUCOPHAGE -XR) 500 MG 24 hr tablet; Take 2 tablets (1,000 mg total) by mouth 2 (two) times daily with a meal.  Dispense: 180 tablet; Refill: 3 - AMB Referral VBCI Care Management  2. Dyslipidemia, goal LDL below 100 - rosuvastatin  (CRESTOR ) 5 MG tablet; Take 1 tablet (5 mg total) by mouth daily.  Dispense: 60 tablet; Refill: 1 - AMB Referral [facility]BCI Care Management    It is important that you exercise regularly at least 30 minutes 5 times a week as tolerated  Think about what you will eat, plan ahead. Choose  clean, green, fresh or frozen over canned, processed or packaged foods which are more sugary, salty and fatty. 70 to 75% of food eaten should be vegetables and fruit. Three meals at set times with snacks allowed between meals, but they must be fruit or vegetables. Aim to eat over a 12 hour period , example 7 am to 7 pm, and STOP after  your last meal of the day. Drink water,generally about 64 ounces per day, no other drink is as healthy. Fruit juice is best enjoyed in a healthy way, by EATING the fruit.  Thanks for choosing [facility] we consider it a privelige to serve you.

## 2024-08-02 ENCOUNTER — Other Ambulatory Visit: Payer: Self-pay

## 2024-08-04 NOTE — Progress Notes (Unsigned)
" ° ° °  Chief Complaint: Problems with erections  History of Present Illness:  39 yo male is sent by Folishade Paseda, FNP for E/M of ED.   Past Medical History:  Past Medical History:  Diagnosis Date   Anemia    Diabetes mellitus without complication (HCC)    GERD without esophagitis 12/02/2020   Hyperlipidemia     Past Surgical History:  Past Surgical History:  Procedure Laterality Date   NO PAST SURGERIES      Allergies:  Allergies[1]  Family History:  Family History  Problem Relation Age of Onset   Diabetes Mother    Hypertension Father    Diabetes Father    Stroke Cousin     Social History:  Social History[2]  Review of symptoms:  Constitutional:  Negative for unexplained weight loss, night sweats, fever, chills ENT:  Negative for nose bleeds, sinus pain, painful swallowing CV:  Negative for chest pain, shortness of breath, exercise intolerance, palpitations, loss of consciousness Resp:  Negative for cough, wheezing, shortness of breath GI:  Negative for nausea, vomiting, diarrhea, bloody stools GU:  Positives noted in HPI; otherwise negative for gross hematuria, dysuria, urinary incontinence Neuro:  Negative for seizures, poor balance, limb weakness, slurred speech Psych:  Negative for lack of energy, depression, anxiety Endocrine:  Negative for polydipsia, polyuria, symptoms of hypoglycemia (dizziness, hunger, sweating) Hematologic:  Negative for anemia, purpura, petechia, prolonged or excessive bleeding, use of anticoagulants  Allergic:  Negative for difficulty breathing or choking as a result of exposure to anything; no shellfish allergy; no allergic response (rash/itch) to materials, foods  Physical exam: There were no vitals taken for this visit. GENERAL APPEARANCE:  Well appearing, well developed, well nourished, NAD HEENT: Atraumatic, Normocephalic. NECK: Normal appearance LUNGS: Normal inspiratory and expiratory excursion HEART: Regular  Rate ABDOMEN: ***. GU: Phallus normal, no lesions. Scrotal skin normal. Testicles/epididymal structures normal. Meatus normal. Normal anal sphincter tone, prostate ***mL, symmetric, non nodular, non tender. EXTREMITIES: Moves all extremities well.  Without clubbing, cyanosis, or edema. NEUROLOGIC:  Alert and oriented x 3, normal gait, CN II-XII grossly intact.  MENTAL STATUS:  Appropriate. SKIN:  Warm, dry and intact.    Results: No results found for this or any previous visit (from the past 24 hours).  I have reviewed referring/prior physicians notes  I have reviewed urinalysis  I have reviewed PSA results  I have reviewed prior imaging  I have reviewed urine culture results  Assessment: ***   Plan: ***     [1]  Allergies Allergen Reactions   Statins Nausea Only and Other (See Comments)    Severe fatigue  [2]  Social History Tobacco Use   Smoking status: Never   Smokeless tobacco: Never  Vaping Use   Vaping status: Never Used  Substance Use Topics   Alcohol use: No   Drug use: No   "

## 2024-08-05 ENCOUNTER — Ambulatory Visit: Payer: Self-pay | Admitting: Urology

## 2024-08-05 DIAGNOSIS — N5201 Erectile dysfunction due to arterial insufficiency: Secondary | ICD-10-CM

## 2024-08-13 ENCOUNTER — Telehealth: Payer: Self-pay

## 2024-08-13 ENCOUNTER — Other Ambulatory Visit: Payer: Self-pay

## 2024-08-13 NOTE — Progress Notes (Unsigned)
 "  08/13/2024 Name: Tyrone Steele MRN: [id number] DOB: [birth year]  No chief complaint on file.   Tyrone Steele is a 39 y.o. year old male who presented for a telephone visit.   They were referred to the pharmacist by their PCP for assistance in managing diabetes and hyperlipidemia/cardiovascular risk reduction. SABRA PMH includes T2DM with neuropathy, HLD, HTN.    Subjective: Patient was last seen by PCP, Tyrone Shall, NP, on 07/24/24. At last visit, patient reported taking metformin  500 mg BID rather than 1000 mg BID. He had not been taking Trulicity  due to cost, though it was available for free at [facility]. Was not taking rosuvastatin  consistently. He was instructed to increase metformin  and work on diet and exercise to control blood sugars.  Today, patient presents in *** good spirits and presents without *** any assistance. ***Patient is accompanied by ***.   LIBERATE?   Care Team: Primary Care Provider: Paseda, Folashade R, FNP ; Next Scheduled Visit: *** {careteamprovider:27366}  Medication Access/Adherence  Current Pharmacy:  [facility] [address] [place] [address] Phone: [phone] Fax: [phone]  [facility] [address] [place] [address] Phone: [phone] Fax: [phone]   Patient reports affordability concerns with their medications: {YES/NO:21197} Patient reports access/transportation concerns to their pharmacy: {YES/NO:21197} Patient reports adherence concerns with their medications:  {YES/NO:21197} ***  *** Patient denies adherence with medications, reports missing *** medications *** times per week, on average.   Diabetes:  Current medications: Trulicity  0.75 mg weekly (not taking), metformin  XR 1000 mg BID Medications tried in the past: ***  Current glucose readings: ***  Date of Download: *** % Time CGM is active: ***% Average Glucose: ***  mg/dL Glucose Management Indicator: ***  Glucose Variability: *** (goal <36%) Time in Goal:  - Time in range 70-180: ***% - Time above range: ***% - Time below range: ***% Observed patterns:  Patient {Actions; denies-reports:120008} hypoglycemic s/sx including ***dizziness, shakiness, sweating. Patient {Actions; denies-reports:120008} hyperglycemic symptoms including ***polyuria, polydipsia, polyphagia, nocturia, neuropathy, blurred vision.  Current meal patterns:  - Breakfast: *** - Lunch *** - Supper *** - Snacks *** - Drinks ***  Current physical activity: ***  Current medication access support: ***  Hyperlipidemia/ASCVD Risk Reduction  Current lipid lowering medications: rosuvastatin  5 mg daily Medications tried in the past:   Antiplatelet regimen:   ASCVD History:  Family History:  Risk Factors:   Current physical activity: ***  Current medication access support: ***  PREVENT Risk Score:  [email or web address] - 10 year risk of CVD: *** - 10 year risk of ASCVD: *** - 10 year risk of HF: ***   Objective:  BP Readings from Last 3 Encounters:  07/24/24 126/78  05/28/24 120/79  05/22/24 118/75    Lab Results  Component Value Date   HGBA1C 10.6 (A) 05/21/2024   HGBA1C 7.8 (A) 07/26/2023   HGBA1C 11.5 (A) 04/21/2023       Latest Ref Rng & Units 05/21/2024    3:20 PM 04/21/2023   10:36 AM 09/01/2021    4:03 PM  BMP  Glucose 70 - 99 mg/dL 830  754  879   BUN 6 - 20 mg/dL 12  16  14    Creatinine 0.76 - 1.27 mg/dL 8.86  9.02  8.99   BUN/Creat Ratio 9 - 20 11  16  14    Sodium 134 - 144 mmol/L 140  139  141   Potassium 3.5 -  5.2 mmol/L 3.8  4.2  4.1   Chloride 96 - 106 mmol/L 106  102  104   CO2 20 - 29 mmol/L 22  22  24    Calcium  8.7 - 10.2 mg/dL 9.5  9.5  89.8     Lab Results  Component Value Date   CHOL 165 05/26/2023   HDL 38 (L) 05/26/2023   LDLCALC 111 (H)  05/26/2023   LDLDIRECT 106 (H) 05/21/2024   TRIG 85 05/26/2023   CHOLHDL 4.3 05/26/2023    Medications Reviewed Today   Medications were not reviewed in this encounter       Assessment/Plan:   Diabetes: - Currently {CHL Controlled/Uncontrolled:[phone]} with most recent A1C of *** {Desc; above/below:16086} goal {CCM A1c HNJOD:[id number]}. Medication adherence appears ***. Control is suboptimal due to ***.   - Last UACR *** - Patient denies personal or family history of multiple endocrine neoplasia type 2, medullary thyroid cancer; personal history of pancreatitis or gallbladder disease.*** - Reviewed long term cardiovascular and renal outcomes of uncontrolled blood sugar - Reviewed goal A1c, goal fasting, and goal 2 hour post prandial glucose - Reviewed hypoglycemia management plan and the rule of 15*** - Reviewed dietary modifications including *** utilizing the healthy plate method, limiting portion size of carbohydrate foods, increasing intake of protein and non-starchy vegetables. Counseled patient to stay hydrated with water throughout the day. - Reviewed lifestyle modifications including: aiming for 150 minutes of moderate intensity exercise every week. *** - Recommend to ***  - Recommend to check glucose twice daily: fasting and 2-hr PPG ***. Counseled patient to bring glucometer or BG log to every appointment. - Next A1C due ***       Hyperlipidemia/ASCVD Risk Reduction: - Currently {CHL Controlled/Uncontrolled:[phone]} with most recent LDL-C of *** {Desc; above/below:16086} goal {LDL Goals:32982} given ***. *** intensity statin indicated. - Reviewed long term complications of uncontrolled cholesterol - Reviewed lifestyle recommendations to lower LDL-C including regular physical activity, 5-10% weight loss, eating a diet low in saturated fat, and increasing intake of fiber to at least 25 g per day. - Reviewed lifestyle recommendations to lower TG including following a  low-carb diet, restricting alcohol intake, and increasing physical activity and exercise - Recommend to ***    Written patient instructions provided. Patient verbalized understanding of treatment plan.   Follow Up Plan:  Pharmacist *** PCP clinic visit in ***   Lorain Baseman, PharmD [facility] [phone]   "

## 2024-08-13 NOTE — Telephone Encounter (Signed)
 Attempted to contact patient for scheduled appointment for medication management. Left HIPAA compliant message for patient to return my call at their convenience.   Lorain Baseman, PharmD [facility] [phone]

## 2024-08-20 ENCOUNTER — Other Ambulatory Visit (HOSPITAL_COMMUNITY): Payer: Self-pay

## 2024-08-20 ENCOUNTER — Ambulatory Visit
Admission: EM | Admit: 2024-08-20 | Discharge: 2024-08-20 | Disposition: A | Discharge status: Home / Self Care | Payer: Self-pay | Source: Home / Self Care

## 2024-08-20 ENCOUNTER — Other Ambulatory Visit: Payer: Self-pay

## 2024-08-20 DIAGNOSIS — A084 Viral intestinal infection, unspecified: Secondary | ICD-10-CM

## 2024-08-20 MED ORDER — ONDANSETRON 4 MG PO TBDP
4.0000 mg | ORAL_TABLET | Freq: Once | ORAL | Status: AC
  Administered 2024-08-20: 4 mg via ORAL

## 2024-08-20 MED ORDER — ONDANSETRON 4 MG PO TBDP
4.0000 mg | ORAL_TABLET | Freq: Three times a day (TID) | ORAL | 0 refills | Status: AC | PRN
  Filled 2024-08-20: qty 8, 3d supply, fill #0

## 2024-08-20 NOTE — Discharge Instructions (Signed)
 You may take Zofran  every 8 hours as needed for nausea or vomiting.  You were given a dose while in the clinic.  Focus on hydration/electrolyte replacement with Gatorade, Powerade, Pedialyte, water.  Bland diet such as bananas, rice, applesauce, toast and advance as you tolerate.  Lots of rest.  Please follow-up with your PCP in 2 to 3 days for recheck.  Please go to the ER if you develop any worsening symptoms.  I hope you feel better soon!

## 2024-09-04 ENCOUNTER — Ambulatory Visit: Payer: Self-pay | Admitting: Nurse Practitioner
